# Patient Record
Sex: Female | Born: 1935 | Race: White | Hispanic: No | Marital: Married | Smoking: Current every day smoker
Health system: Southern US, Community
[De-identification: ages and names within clinical notes are randomized; demographics above are authoritative.]

## PROBLEM LIST (undated history)

## (undated) DIAGNOSIS — C9590 Leukemia, unspecified not having achieved remission: Secondary | ICD-10-CM

## (undated) DIAGNOSIS — H5440 Blindness, one eye, unspecified eye: Secondary | ICD-10-CM

## (undated) DIAGNOSIS — I10 Essential (primary) hypertension: Secondary | ICD-10-CM

## (undated) DIAGNOSIS — I6529 Occlusion and stenosis of unspecified carotid artery: Secondary | ICD-10-CM

## (undated) DIAGNOSIS — IMO0002 Reserved for concepts with insufficient information to code with codable children: Secondary | ICD-10-CM

## (undated) DIAGNOSIS — C4490 Unspecified malignant neoplasm of skin, unspecified: Secondary | ICD-10-CM

## (undated) DIAGNOSIS — I639 Cerebral infarction, unspecified: Secondary | ICD-10-CM

## (undated) DIAGNOSIS — H9192 Unspecified hearing loss, left ear: Secondary | ICD-10-CM

## (undated) DIAGNOSIS — J449 Chronic obstructive pulmonary disease, unspecified: Secondary | ICD-10-CM

---

## 1998-11-08 ENCOUNTER — Ambulatory Visit: Admission: RE | Admit: 1998-11-08 | Discharge: 1998-11-08 | Payer: Self-pay | Admitting: Internal Medicine

## 2000-06-11 ENCOUNTER — Encounter: Payer: Self-pay | Admitting: Endocrinology

## 2000-06-12 ENCOUNTER — Inpatient Hospital Stay (HOSPITAL_COMMUNITY): Admission: EM | Admit: 2000-06-12 | Discharge: 2000-06-13 | Payer: Self-pay | Admitting: Emergency Medicine

## 2000-06-14 ENCOUNTER — Emergency Department (HOSPITAL_COMMUNITY): Admission: EM | Admit: 2000-06-14 | Discharge: 2000-06-14 | Payer: Self-pay | Admitting: Internal Medicine

## 2000-06-14 ENCOUNTER — Encounter: Payer: Self-pay | Admitting: Emergency Medicine

## 2000-09-05 ENCOUNTER — Encounter: Admission: RE | Admit: 2000-09-05 | Discharge: 2000-09-05 | Payer: Self-pay

## 2001-07-14 ENCOUNTER — Encounter: Payer: Self-pay | Admitting: Internal Medicine

## 2001-07-14 ENCOUNTER — Encounter: Admission: RE | Admit: 2001-07-14 | Discharge: 2001-07-14 | Payer: Self-pay | Admitting: Internal Medicine

## 2001-11-19 ENCOUNTER — Other Ambulatory Visit: Admission: RE | Admit: 2001-11-19 | Discharge: 2001-11-19 | Payer: Self-pay | Admitting: *Deleted

## 2002-01-11 ENCOUNTER — Ambulatory Visit (HOSPITAL_COMMUNITY): Admission: RE | Admit: 2002-01-11 | Discharge: 2002-01-11 | Payer: Self-pay | Admitting: *Deleted

## 2002-01-11 ENCOUNTER — Encounter: Payer: Self-pay | Admitting: *Deleted

## 2002-03-11 ENCOUNTER — Ambulatory Visit (HOSPITAL_COMMUNITY): Admission: RE | Admit: 2002-03-11 | Discharge: 2002-03-11 | Payer: Self-pay | Admitting: *Deleted

## 2002-03-11 ENCOUNTER — Encounter: Payer: Self-pay | Admitting: *Deleted

## 2002-07-13 ENCOUNTER — Encounter: Payer: Self-pay | Admitting: *Deleted

## 2002-07-13 ENCOUNTER — Encounter: Admission: RE | Admit: 2002-07-13 | Discharge: 2002-07-13 | Payer: Self-pay | Admitting: *Deleted

## 2003-04-11 ENCOUNTER — Ambulatory Visit (HOSPITAL_COMMUNITY): Admission: RE | Admit: 2003-04-11 | Discharge: 2003-04-11 | Payer: Self-pay | Admitting: Oncology

## 2003-07-11 ENCOUNTER — Encounter: Admission: RE | Admit: 2003-07-11 | Discharge: 2003-07-11 | Payer: Self-pay | Admitting: Internal Medicine

## 2003-08-04 ENCOUNTER — Encounter: Admission: RE | Admit: 2003-08-04 | Discharge: 2003-08-04 | Payer: Self-pay | Admitting: Gastroenterology

## 2004-02-02 ENCOUNTER — Encounter: Admission: RE | Admit: 2004-02-02 | Discharge: 2004-02-02 | Payer: Self-pay | Admitting: Internal Medicine

## 2004-04-23 ENCOUNTER — Ambulatory Visit: Payer: Self-pay | Admitting: Oncology

## 2004-10-22 ENCOUNTER — Ambulatory Visit: Payer: Self-pay | Admitting: Internal Medicine

## 2005-01-18 ENCOUNTER — Ambulatory Visit: Payer: Self-pay | Admitting: Internal Medicine

## 2005-02-04 ENCOUNTER — Encounter: Admission: RE | Admit: 2005-02-04 | Discharge: 2005-02-04 | Payer: Self-pay | Admitting: Internal Medicine

## 2005-04-19 ENCOUNTER — Ambulatory Visit: Payer: Self-pay | Admitting: Internal Medicine

## 2005-07-19 ENCOUNTER — Ambulatory Visit: Payer: Self-pay | Admitting: Internal Medicine

## 2005-07-22 LAB — CBC WITH DIFFERENTIAL/PLATELET
BASO%: 0.5 % (ref 0.0–2.0)
Basophils Absolute: 0.2 10*3/uL — ABNORMAL HIGH (ref 0.0–0.1)
HCT: 38.1 % (ref 34.8–46.6)
HGB: 12.5 g/dL (ref 11.6–15.9)
MONO#: 0.5 10*3/uL (ref 0.1–0.9)
NEUT%: 9 % — ABNORMAL LOW (ref 39.6–76.8)
WBC: 38.5 10*3/uL — ABNORMAL HIGH (ref 3.9–10.0)
lymph#: 34.2 10*3/uL — ABNORMAL HIGH (ref 0.9–3.3)

## 2005-10-14 ENCOUNTER — Ambulatory Visit: Payer: Self-pay | Admitting: Internal Medicine

## 2005-10-21 LAB — CBC WITH DIFFERENTIAL/PLATELET
Eosinophils Absolute: 0 10*3/uL (ref 0.0–0.5)
MONO#: 0.5 10*3/uL (ref 0.1–0.9)
NEUT#: 5.1 10*3/uL (ref 1.5–6.5)
RBC: 3.98 10*6/uL (ref 3.70–5.32)
RDW: 13.9 % (ref 11.3–14.5)
WBC: 45.5 10*3/uL — ABNORMAL HIGH (ref 3.9–10.0)
lymph#: 39.8 10*3/uL — ABNORMAL HIGH (ref 0.9–3.3)

## 2005-10-21 LAB — LACTATE DEHYDROGENASE: LDH: 139 U/L (ref 94–250)

## 2006-01-16 ENCOUNTER — Ambulatory Visit: Payer: Self-pay | Admitting: Internal Medicine

## 2006-02-06 LAB — CBC WITH DIFFERENTIAL/PLATELET
Basophils Absolute: 0.2 10*3/uL — ABNORMAL HIGH (ref 0.0–0.1)
Eosinophils Absolute: 0.1 10*3/uL (ref 0.0–0.5)
HGB: 12.9 g/dL (ref 11.6–15.9)
MCV: 94.9 fL (ref 81.0–101.0)
MONO#: 0.5 10*3/uL (ref 0.1–0.9)
MONO%: 1.1 % (ref 0.0–13.0)
NEUT#: 5 10*3/uL (ref 1.5–6.5)
RDW: 13.4 % (ref 11.3–14.5)

## 2006-02-17 ENCOUNTER — Encounter: Admission: RE | Admit: 2006-02-17 | Discharge: 2006-02-17 | Payer: Self-pay | Admitting: Internal Medicine

## 2006-04-16 ENCOUNTER — Ambulatory Visit: Payer: Self-pay | Admitting: Internal Medicine

## 2006-04-29 LAB — CBC WITH DIFFERENTIAL/PLATELET
Basophils Absolute: 0 10*3/uL (ref 0.0–0.1)
EOS%: 2.2 % (ref 0.0–7.0)
Eosinophils Absolute: 0.8 10*3/uL — ABNORMAL HIGH (ref 0.0–0.5)
HGB: 13.4 g/dL (ref 11.6–15.9)
LYMPH%: 88.1 % — ABNORMAL HIGH (ref 14.0–48.0)
MCH: 30.1 pg (ref 26.0–34.0)
MCV: 91.4 fL (ref 81.0–101.0)
MONO%: 1.4 % (ref 0.0–13.0)
NEUT#: 3.2 10*3/uL (ref 1.5–6.5)
NEUT%: 8.3 % — ABNORMAL LOW (ref 39.6–76.8)
Platelets: 304 10*3/uL (ref 145–400)
RDW: 13.1 % (ref 11.3–14.5)

## 2006-07-24 ENCOUNTER — Ambulatory Visit: Payer: Self-pay | Admitting: Internal Medicine

## 2006-07-29 LAB — CBC WITH DIFFERENTIAL/PLATELET
BASO%: 0.3 % (ref 0.0–2.0)
EOS%: 0.1 % (ref 0.0–7.0)
HCT: 39 % (ref 34.8–46.6)
LYMPH%: 84.9 % — ABNORMAL HIGH (ref 14.0–48.0)
MCH: 30 pg (ref 26.0–34.0)
MCHC: 33.3 g/dL (ref 32.0–36.0)
MCV: 90.2 fL (ref 81.0–101.0)
MONO#: 0.5 10*3/uL (ref 0.1–0.9)
MONO%: 1.2 % (ref 0.0–13.0)
NEUT%: 13.5 % — ABNORMAL LOW (ref 39.6–76.8)
Platelets: 227 10*3/uL (ref 145–400)
RBC: 4.32 10*6/uL (ref 3.70–5.32)

## 2006-07-29 LAB — LACTATE DEHYDROGENASE: LDH: 153 U/L (ref 94–250)

## 2006-10-24 ENCOUNTER — Ambulatory Visit: Payer: Self-pay | Admitting: Internal Medicine

## 2006-10-28 LAB — CBC WITH DIFFERENTIAL/PLATELET
BASO%: 0.1 % (ref 0.0–2.0)
Basophils Absolute: 0.1 10*3/uL (ref 0.0–0.1)
EOS%: 0.1 % (ref 0.0–7.0)
HGB: 12.5 g/dL (ref 11.6–15.9)
MCH: 31.1 pg (ref 26.0–34.0)
MCHC: 33.9 g/dL (ref 32.0–36.0)
MCV: 91.6 fL (ref 81.0–101.0)
MONO%: 2.2 % (ref 0.0–13.0)
RDW: 13.4 % (ref 11.3–14.5)

## 2007-01-22 ENCOUNTER — Ambulatory Visit: Payer: Self-pay | Admitting: Internal Medicine

## 2007-02-05 LAB — CBC WITH DIFFERENTIAL/PLATELET
BASO%: 0.4 % (ref 0.0–2.0)
EOS%: 0.1 % (ref 0.0–7.0)
HCT: 36.7 % (ref 34.8–46.6)
LYMPH%: 90.9 % — ABNORMAL HIGH (ref 14.0–48.0)
MCH: 30.9 pg (ref 26.0–34.0)
MCHC: 33.8 g/dL (ref 32.0–36.0)
MCV: 91.6 fL (ref 81.0–101.0)
MONO#: 0.6 10*3/uL (ref 0.1–0.9)
MONO%: 1.1 % (ref 0.0–13.0)
NEUT%: 7.5 % — ABNORMAL LOW (ref 39.6–76.8)
Platelets: 293 10*3/uL (ref 145–400)
RBC: 4.01 10*6/uL (ref 3.70–5.32)

## 2007-02-13 ENCOUNTER — Ambulatory Visit (HOSPITAL_COMMUNITY): Admission: RE | Admit: 2007-02-13 | Discharge: 2007-02-13 | Payer: Self-pay | Admitting: Internal Medicine

## 2007-02-13 LAB — COMPREHENSIVE METABOLIC PANEL
AST: 31 U/L (ref 0–37)
Albumin: 4 g/dL (ref 3.5–5.2)
Alkaline Phosphatase: 62 U/L (ref 39–117)
BUN: 11 mg/dL (ref 6–23)
Creatinine, Ser: 0.77 mg/dL (ref 0.40–1.20)
Potassium: 4.4 mEq/L (ref 3.5–5.3)

## 2007-02-13 LAB — CBC WITH DIFFERENTIAL/PLATELET
BASO%: 0.5 % (ref 0.0–2.0)
Basophils Absolute: 0.3 10*3/uL — ABNORMAL HIGH (ref 0.0–0.1)
EOS%: 0.3 % (ref 0.0–7.0)
HGB: 12 g/dL (ref 11.6–15.9)
MCH: 31.1 pg (ref 26.0–34.0)
MCHC: 33.8 g/dL (ref 32.0–36.0)
MCV: 91.9 fL (ref 81.0–101.0)
MONO%: 1.9 % (ref 0.0–13.0)
RDW: 14 % (ref 11.3–14.5)

## 2007-08-14 ENCOUNTER — Ambulatory Visit: Payer: Self-pay | Admitting: Internal Medicine

## 2007-08-18 LAB — COMPREHENSIVE METABOLIC PANEL
AST: 18 U/L (ref 0–37)
Alkaline Phosphatase: 59 U/L (ref 39–117)
BUN: 12 mg/dL (ref 6–23)
Calcium: 9 mg/dL (ref 8.4–10.5)
Chloride: 104 mEq/L (ref 96–112)
Creatinine, Ser: 0.74 mg/dL (ref 0.40–1.20)

## 2007-08-18 LAB — CBC WITH DIFFERENTIAL/PLATELET
Basophils Absolute: 0 10*3/uL (ref 0.0–0.1)
EOS%: 0.2 % (ref 0.0–7.0)
HCT: 37 % (ref 34.8–46.6)
HGB: 12.4 g/dL (ref 11.6–15.9)
MCH: 30.6 pg (ref 26.0–34.0)
MCV: 91.5 fL (ref 81.0–101.0)
MONO%: 2 % (ref 0.0–13.0)
NEUT%: 13 % — ABNORMAL LOW (ref 39.6–76.8)

## 2008-02-15 ENCOUNTER — Ambulatory Visit: Payer: Self-pay | Admitting: Internal Medicine

## 2008-02-24 LAB — CBC WITH DIFFERENTIAL/PLATELET
BASO%: 0.2 % (ref 0.0–2.0)
Basophils Absolute: 0.1 10*3/uL (ref 0.0–0.1)
EOS%: 0.2 % (ref 0.0–7.0)
Eosinophils Absolute: 0.1 10*3/uL (ref 0.0–0.5)
HCT: 36.2 % (ref 34.8–46.6)
HGB: 12 g/dL (ref 11.6–15.9)
LYMPH%: 88.3 % — ABNORMAL HIGH (ref 14.0–48.0)
MCH: 30.4 pg (ref 26.0–34.0)
MCHC: 33.1 g/dL (ref 32.0–36.0)
MCV: 91.8 fL (ref 81.0–101.0)
MONO#: 0.9 10*3/uL (ref 0.1–0.9)
MONO%: 1.8 % (ref 0.0–13.0)
NEUT#: 4.7 10*3/uL (ref 1.5–6.5)
NEUT%: 9.5 % — ABNORMAL LOW (ref 39.6–76.8)
Platelets: 243 10*3/uL (ref 145–400)
RBC: 3.95 10*6/uL (ref 3.70–5.32)
RDW: 13.9 % (ref 11.3–14.5)
WBC: 49.3 10*3/uL — ABNORMAL HIGH (ref 3.9–10.0)
lymph#: 43.5 10*3/uL — ABNORMAL HIGH (ref 0.9–3.3)

## 2008-02-24 LAB — COMPREHENSIVE METABOLIC PANEL
ALT: 15 U/L (ref 0–35)
AST: 21 U/L (ref 0–37)
Chloride: 103 mEq/L (ref 96–112)
Creatinine, Ser: 0.93 mg/dL (ref 0.40–1.20)
Total Bilirubin: 0.4 mg/dL (ref 0.3–1.2)

## 2008-03-28 ENCOUNTER — Encounter: Admission: RE | Admit: 2008-03-28 | Discharge: 2008-03-28 | Payer: Self-pay | Admitting: Endocrinology

## 2008-06-05 ENCOUNTER — Encounter: Admission: RE | Admit: 2008-06-05 | Discharge: 2008-06-05 | Payer: Self-pay | Admitting: Endocrinology

## 2008-06-14 ENCOUNTER — Encounter: Admission: RE | Admit: 2008-06-14 | Discharge: 2008-06-14 | Payer: Self-pay | Admitting: Endocrinology

## 2008-07-19 ENCOUNTER — Ambulatory Visit (HOSPITAL_COMMUNITY): Admission: RE | Admit: 2008-07-19 | Discharge: 2008-07-19 | Payer: Self-pay | Admitting: *Deleted

## 2008-08-26 ENCOUNTER — Ambulatory Visit: Payer: Self-pay | Admitting: Internal Medicine

## 2008-08-30 LAB — CBC WITH DIFFERENTIAL/PLATELET
BASO%: 0.1 % (ref 0.0–2.0)
Basophils Absolute: 0.1 10*3/uL (ref 0.0–0.1)
EOS%: 0.1 % (ref 0.0–7.0)
Eosinophils Absolute: 0 10*3/uL (ref 0.0–0.5)
HCT: 37.8 % (ref 34.8–46.6)
HGB: 12.2 g/dL (ref 11.6–15.9)
LYMPH%: 86 % — ABNORMAL HIGH (ref 14.0–49.7)
MCH: 29.4 pg (ref 25.1–34.0)
MCHC: 32.3 g/dL (ref 31.5–36.0)
MCV: 90.9 fL (ref 79.5–101.0)
MONO#: 1 10*3/uL — ABNORMAL HIGH (ref 0.1–0.9)
MONO%: 2 % (ref 0.0–14.0)
NEUT#: 6 10*3/uL (ref 1.5–6.5)
NEUT%: 11.8 % — ABNORMAL LOW (ref 38.4–76.8)
Platelets: 331 10*3/uL (ref 145–400)
RBC: 4.16 10*6/uL (ref 3.70–5.45)
RDW: 14.8 % — ABNORMAL HIGH (ref 11.2–14.5)
WBC: 50.5 10*3/uL (ref 3.9–10.3)
lymph#: 43.4 10*3/uL — ABNORMAL HIGH (ref 0.9–3.3)

## 2008-08-30 LAB — LACTATE DEHYDROGENASE: LDH: 178 U/L (ref 94–250)

## 2009-03-23 ENCOUNTER — Ambulatory Visit: Payer: Self-pay | Admitting: Internal Medicine

## 2009-03-27 LAB — CBC WITH DIFFERENTIAL/PLATELET
BASO%: 0.2 % (ref 0.0–2.0)
Basophils Absolute: 0.1 10*3/uL (ref 0.0–0.1)
EOS%: 0.1 % (ref 0.0–7.0)
Eosinophils Absolute: 0.1 10*3/uL (ref 0.0–0.5)
HCT: 36.8 % (ref 34.8–46.6)
HGB: 12.1 g/dL (ref 11.6–15.9)
LYMPH%: 87.9 % — ABNORMAL HIGH (ref 14.0–49.7)
MCH: 30.4 pg (ref 25.1–34.0)
MCHC: 32.7 g/dL (ref 31.5–36.0)
MCV: 92.8 fL (ref 79.5–101.0)
MONO#: 0.7 10*3/uL (ref 0.1–0.9)
MONO%: 1.2 % (ref 0.0–14.0)
NEUT#: 5.8 10*3/uL (ref 1.5–6.5)
NEUT%: 10.6 % — ABNORMAL LOW (ref 38.4–76.8)
Platelets: 263 10*3/uL (ref 145–400)
RBC: 3.97 10*6/uL (ref 3.70–5.45)
RDW: 15.6 % — ABNORMAL HIGH (ref 11.2–14.5)
WBC: 54.8 10*3/uL (ref 3.9–10.3)
lymph#: 48.1 10*3/uL — ABNORMAL HIGH (ref 0.9–3.3)

## 2009-03-27 LAB — COMPREHENSIVE METABOLIC PANEL
ALT: 13 U/L (ref 0–35)
AST: 20 U/L (ref 0–37)
Albumin: 4.5 g/dL (ref 3.5–5.2)
Alkaline Phosphatase: 61 U/L (ref 39–117)
BUN: 8 mg/dL (ref 6–23)
CO2: 29 mEq/L (ref 19–32)
Calcium: 9.2 mg/dL (ref 8.4–10.5)
Chloride: 103 mEq/L (ref 96–112)
Creatinine, Ser: 0.84 mg/dL (ref 0.40–1.20)
Glucose, Bld: 102 mg/dL — ABNORMAL HIGH (ref 70–99)
Potassium: 3.7 mEq/L (ref 3.5–5.3)
Sodium: 141 mEq/L (ref 135–145)
Total Bilirubin: 0.5 mg/dL (ref 0.3–1.2)
Total Protein: 7.1 g/dL (ref 6.0–8.3)

## 2009-03-27 LAB — LACTATE DEHYDROGENASE: LDH: 152 U/L (ref 94–250)

## 2009-08-02 ENCOUNTER — Encounter: Admission: RE | Admit: 2009-08-02 | Discharge: 2009-08-02 | Payer: Self-pay | Admitting: Endocrinology

## 2009-09-22 ENCOUNTER — Ambulatory Visit: Payer: Self-pay | Admitting: Internal Medicine

## 2009-09-26 LAB — CBC WITH DIFFERENTIAL/PLATELET
BASO%: 0.3 % (ref 0.0–2.0)
Basophils Absolute: 0.2 10*3/uL — ABNORMAL HIGH (ref 0.0–0.1)
EOS%: 0.2 % (ref 0.0–7.0)
Eosinophils Absolute: 0.1 10*3/uL (ref 0.0–0.5)
HCT: 37 % (ref 34.8–46.6)
HGB: 12 g/dL (ref 11.6–15.9)
LYMPH%: 89.5 % — ABNORMAL HIGH (ref 14.0–49.7)
MCH: 29.3 pg (ref 25.1–34.0)
MCHC: 32.5 g/dL (ref 31.5–36.0)
MCV: 90.1 fL (ref 79.5–101.0)
MONO#: 1.4 10*3/uL — ABNORMAL HIGH (ref 0.1–0.9)
MONO%: 2.7 % (ref 0.0–14.0)
NEUT#: 3.9 10*3/uL (ref 1.5–6.5)
NEUT%: 7.3 % — ABNORMAL LOW (ref 38.4–76.8)
Platelets: 257 10*3/uL (ref 145–400)
RBC: 4.11 10*6/uL (ref 3.70–5.45)
RDW: 15.7 % — ABNORMAL HIGH (ref 11.2–14.5)
WBC: 52.6 10*3/uL (ref 3.9–10.3)
lymph#: 47.1 10*3/uL — ABNORMAL HIGH (ref 0.9–3.3)

## 2009-09-26 LAB — LACTATE DEHYDROGENASE: LDH: 170 U/L (ref 94–250)

## 2010-03-27 ENCOUNTER — Ambulatory Visit: Payer: Self-pay | Admitting: Internal Medicine

## 2010-03-28 LAB — CBC WITH DIFFERENTIAL/PLATELET
BASO%: 0.3 % (ref 0.0–2.0)
Basophils Absolute: 0.1 10*3/uL (ref 0.0–0.1)
EOS%: 0.2 % (ref 0.0–7.0)
Eosinophils Absolute: 0.1 10*3/uL (ref 0.0–0.5)
HCT: 33.6 % — ABNORMAL LOW (ref 34.8–46.6)
HGB: 10.9 g/dL — ABNORMAL LOW (ref 11.6–15.9)
LYMPH%: 90.3 % — ABNORMAL HIGH (ref 14.0–49.7)
MCH: 28.7 pg (ref 25.1–34.0)
MCHC: 32.6 g/dL (ref 31.5–36.0)
MCV: 88 fL (ref 79.5–101.0)
MONO#: 0.7 10*3/uL (ref 0.1–0.9)
MONO%: 1.4 % (ref 0.0–14.0)
NEUT#: 3.9 10*3/uL (ref 1.5–6.5)
NEUT%: 7.8 % — ABNORMAL LOW (ref 38.4–76.8)
Platelets: 245 10*3/uL (ref 145–400)
RBC: 3.81 10*6/uL (ref 3.70–5.45)
RDW: 15.9 % — ABNORMAL HIGH (ref 11.2–14.5)
WBC: 49.9 10*3/uL — ABNORMAL HIGH (ref 3.9–10.3)
lymph#: 45 10*3/uL — ABNORMAL HIGH (ref 0.9–3.3)

## 2010-03-28 LAB — LACTATE DEHYDROGENASE: LDH: 143 U/L (ref 94–250)

## 2010-08-21 NOTE — Op Note (Signed)
NAMESHERONICA, COREY                ACCOUNT NO.:  [id number]   MEDICAL RECORD NO.:  [id number]          PATIENT TYPE:  AMB   LOCATION:  ENDO                         FACILITY:  [facility]   PHYSICIAN:  Georgiana Spinner, M.D.    DATE OF BIRTH:  Nov 04, 1930   DATE OF PROCEDURE:  07/19/2008  DATE OF DISCHARGE:                               OPERATIVE REPORT   PROCEDURE:  Upper endoscopy.   INDICATIONS FOR PROCEDURE:  Gastroesophageal reflux disease..   ANESTHESIA:  Fentanyl 100 mcg, versed 10 mg.   PROCEDURE IN DETAIL:  With the patient in a highly sedated in the left  lateral decubitus position, the Pentax videoscopic endoscope was  inserted and now under direct vision through the esophagus, which  appeared normal, into the stomach.  Fundus, body, antrum, duodenal bulb,  second portion of the duodenum were visualized and all appeared normal.  From this point, the endoscope was slowly withdrawn, taken  circumferential views of colonic mucosa until the endoscope was pulled  back and the stomach flexed in retroflexion into the stomach above.  The  endoscope was straightened and withdrawn, taking circumferential views  of any gastric esophageal mucosa.  The patient's vital signs on pulse  oximetry remained stable.  The patient tolerated the procedure well  without any apparent complications.   FINDINGS:  Rather unremarkable endoscopic examination.   PLAN:  Proceed to colonoscopy.           ______________________________  Georgiana Spinner, M.D.     GMO/MEDQ  D:  07/19/2008  T:  07/19/2008  Job:  [id number]

## 2010-08-21 NOTE — Op Note (Signed)
NAMEANNASOPHIA, Heather Avery                ACCOUNT NO.:  [id number]   MEDICAL RECORD NO.:  [id number]          PATIENT TYPE:  AMB   LOCATION:  ENDO                         FACILITY:  [facility]   PHYSICIAN:  Georgiana Spinner, M.D.    DATE OF BIRTH:  [birth year]   DATE OF PROCEDURE:  07/19/2008  DATE OF DISCHARGE:                               OPERATIVE REPORT   PROCEDURE:  Colonoscopy.   INDICATIONS:  Cancer screening, colon polyps.   ANESTHESIA:  No further anesthesia given.   PROCEDURE:  With the patient mildly sedated in the left lateral  decubitus position, the Pentax videoscopic pediatric colonoscope was  inserted in the rectum and passed under direct vision with pressure  applied.  We went through a rather significant tortuosity and  diverticula-filled sigmoid colon but subsequently were able to reach the  cecum, identified by the ileocecal valve and appendiceal orifice, both  of which were photographed. From this point, the colonoscope was slowly  withdrawn, taking circumferential views of the colonic mucosa, stopping  only in the rectum which appeared normal on direct and showed  hemorrhoids on retroflexed view. The endoscope was then straightened and  withdrawn. The patient's vital signs and pulse oximetry remained stable.  The patient tolerated the procedure well without apparent complications.   FINDINGS:  Significant diverticulosis and tortuosity of the sigmoid  colon and internal hemorrhoids.  Otherwise unremarkable exam.   PLAN:  Consider repeat examination in 5-10 years.           ______________________________  Georgiana Spinner, M.D.     GMO/MEDQ  D:  07/19/2008  T:  07/19/2008  Job:  [id number]

## 2010-08-24 NOTE — H&P (Signed)
[facility]  Patient:    Heather Avery, Heather Avery                       MRN: [id number] Adm. Date:  [id number] Attending:  Jenel Lucks Dictator:   Bernadene Person, M.D.                         History and Physical  DATE OF BIRTH:  09-05-1930.  CHIEF COMPLAINT:  Blind in right eye, a complete right carotid occlusion.  HISTORY OF PRESENT ILLNESS:  [age]-year-old white female patient of Dr. 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  She underwent carotid Dopplers, December 11, 1999, by Dr. Kristen Loader. Early and report indicated that right internal carotid artery stenosis was approximately 60-80%.  At that time, she was taking aspirin and they planned to repeat carotid Doppler in six months unless any unexplained symptoms occurred.  Yesterday morning, she awoke with decreased vision in her right eye, only able to see darkness described as "a blurred light."  She went to Dr. Nicholes Mango office and Doppler completed did reveal a complete occlusion of the right internal carotid artery.  Subsequent CAT scan of the head did reveal a subacute retinal infarct.  She also describes having symptoms one week ago of a heaviness in the left arm that lasted several hours but resolved; otherwise, she had had no numbness, tingling, other unilateral symptoms or any other neurological symptoms.  She has had no associated chest pain, shortness of breath, palpitations, nausea, vomiting, diaphoresis, change in speech or gait.  She has been admitted for heparinization, Coumadin and subsequent Plavix treatment; this plan was discussed with Dr. Arbie Cookey.  She had been on aspirin once daily but stopped taking it approximately one week ago because she began taking Motrin for arthritis pain and was concerned about taking both medications together.  CURRENT MEDICATIONS  1. Dyazide 25/37.5 mg one q.d.  2.  Zoloft 25 mg one q.d.  3. Multivitamin q.d.  4. Zocor 40 mg h.s.  5. Aspirin q.d., noting that she did stop taking aspirin last week.  ALLERGIES:  CODEINE -- PREVIOUS CODEINE IN COUGH SYRUP did cause mild eye swelling.  PAST MEDICAL HISTORY:  Right carotid stenosis 60-80%, diagnosed December 11, 1999.  Cervical and thoracic degenerative disk disease. Osteoarthritis.  A normal bone DEXA scan, July of 2001.  Chronic smoker of 1 pack per day for 43 years.  COPD.  Hyperlipidemia, treated with Zocor. Hypertension, treated with Dyazide.  Depression, treated with Zoloft. Intermittent mild lower extremity edema.  PAST SURGICAL HISTORY:  Previous surgeries include hysterectomy secondary to fibroids, cholecystectomy, hemorrhoidectomy and basal cell removed from the right facial area.  FAMILY HISTORY:  Mother deceased at 75 with hypertension, Alzheimers and several strokes.  Father died at 43 with multiple sclerosis but he did really die secondary to pneumonia.  She has one half sister who is 10 and healthy. Has two daughters and one son whom are also quite healthy.  SOCIAL HISTORY:  She is married with three children.  Denies any alcohol use but has smoked approximately 1 pack per day for 43 years.  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  REVIEW OF SYSTEMS:  Negative except as described above in history of present illness.  LABORATORY VALUES:  INR 0.9.  Comprehensive metabolic  panel normal.  CBC normal.  CAT scan of head revealed a subacute infarct within the right posterior MCA distribution without any evidence of hemorrhage.  PHYSICAL EXAMINATION  VITAL SIGNS:  Pulse 60 and regular, blood pressure 130/59, respirations 12. Temperature is not available.  GENERAL:  A pleasant, well-developed, well-nourished white female.  She is resting comfortably supine in no acute distress.  Her breakfast is brought in and she is hungry and eager  to eat that.  SKIN:  Warm and dry without any evidence of rash.  HEENT:  Normocephalic, atraumatic.  Both pupils are equally round and react to light and accommodation.  Extraocular muscles are intact and there is no evidence of nystagmus.  Funduscopic exam is similar bilaterally without any evidence of hemorrhage or papilledema.  Oropharynx is clear.  NECK:  Supple without bruit.  LUNGS:  Clear to auscultation bilaterally.  CARDIOVASCULAR:  Normal sinus rhythm without murmur, rub or gallop.  ABDOMEN:  Mildly obese.  Bowel sounds present.  Soft, nontender, nondistended. No rebound, mass or organomegaly.  EXTREMITIES:  Without clubbing, cyanosis, or edema.  GENITOURINARY:  Deferred, noncontributory.  RECTAL:  Deferred, noncontributory.  NEUROLOGIC:  She is alert and oriented x 3.  She has normal speech.  No evidence of tremor.  Her gait has not been tested but she moves about the bed easily and can move all extremities against resistance.  She does have decreased visual acuity in the right eye.  ASSESSMENT  1. 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  2. Complete occlusion of the right internal carotid artery.  3. Hypertension, controlled with Dyazide.  4. Chronic smoker.  5. Hyperlipidemia, treated with Zocor.  Her dosage of Zocor was just     increased to 40 mg on February 13th.  6. Osteoarthritis.  7. Degenerative disk disease of the cervical and thoracic spine.  8. Chronic obstructive pulmonary disease.  9. Depression, treated with Zoloft. 10. Intermittent mild lower extremity edema, currently stable. 11. Previous surgeries:     a. Hysterectomy secondary to fibroids.     b. Cholecystectomy.     c. Hemorrhoidectomy.     f. Right facial basal cell cancer removed. 12. Normal bone DEXA scan, July 2001.  PLAN:  Admit to Dr. Shelva Majestic.  Will initiate heparinization with intent to begin Coumadin as well as subsequent Plavix; will otherwise continue her  home medications and aggressively treat her hyperlipidemia as well as control of blood pressure.  DD:  06/12/00 TD:  06/12/00 Job: [id number]

## 2010-08-24 NOTE — Discharge Summary (Signed)
[facility]  Patient:    Heather Avery, Heather Avery                       MRN: [id number] Adm. Date:  [id number] Disc. Date: 06/13/00 Attending:  Jenel Lucks                           Discharge Summary  REASON FOR ADMISSION:         "Blind in her right eye."  HOSPITAL COURSE:              The patient was admitted for "blind in her right eye" and has been found to have a complete right carotid artery occlusion. She has been seen by CVTS who feel that there is no way that they can do any operative intervention and they recommend her to be on Coumadin and subsequent Plavix. She is currently heparinized and my plan is for her to go home with Lovenox subcutaneously and Coumadin and continue to follow her as an outpatient.  CURRENT MEDICATIONS:          1. Dyazide 25/37.5 mg one p.o.q.d.                               2. Zoloft 25 mg p.o.q.d.                               3. Multivitamin.                               4. Zocor 40 mg p.o. q.h.s.                               5. Aspirin one p.o.q.d. (although she did stop                                  taking her aspirin last week).  ALLERGIES:                    CODEINE.  HOSPITAL COURSE:              On hospitalization she has been totally fine, although, with some blindness in her right eye. She is on a heparin drip and hopefully, we will switch her over to Lovenox and Coumadin today. I will discuss this with Dr. Arbie Cookey as well as pharmacy management. Her discharge medicines will be her home medicines as well as Lovenox, I believe her dose will be 70 mg b.i.d. subcutaneously, however, I will verify this with pharmacy as well as Coumadin 5 mg p.o.q.d. x 3 and we will recheck her INR and PT in my office on Monday.  She did have a normal chest x-ray, normal electrolytes and normal CBC on admission. She did have a CT of the head without contrast which showed subcutaneous infarct within the right posterior MCA  distribution, no evidence of hemorrhage on June 11, 2000. Her EKG was sinus rhythm.  DISCHARGE MEDICATIONS:        1. Dyazide 25/37.5 mg one p.o.q.d.  2. Zoloft 25 mg p.o.q.d.                               3. Multivitamin.                               4. Zocor 40 mg p.o. q.h.s.                               5. Aspirin one p.o.q.d.                               6. *Lovenox 70 mg b.i.d. (new-check with                                  pharmacy).                               7. *Coumadin 5 mg p.o.q.d.(new-check with                                  pharmacy).  DISCHARGE INSTRUCTIONS:       1. Her diet is cardiac.                               2. She has no wound care.                               3. No restrictions on activity.                               4. Her follow-up is as above. DD:  06/13/00 TD:  06/13/00 Job: [id number]

## 2010-10-02 ENCOUNTER — Other Ambulatory Visit: Payer: Self-pay | Admitting: Internal Medicine

## 2010-10-02 ENCOUNTER — Encounter (HOSPITAL_BASED_OUTPATIENT_CLINIC_OR_DEPARTMENT_OTHER): Payer: Medicare Other | Admitting: Internal Medicine

## 2010-10-02 DIAGNOSIS — C9110 Chronic lymphocytic leukemia of B-cell type not having achieved remission: Secondary | ICD-10-CM

## 2010-10-02 LAB — LACTATE DEHYDROGENASE: LDH: 212 U/L (ref 94–250)

## 2010-10-02 LAB — COMPREHENSIVE METABOLIC PANEL
ALT: 16 U/L (ref 0–35)
AST: 23 U/L (ref 0–37)
Alkaline Phosphatase: 76 U/L (ref 39–117)
BUN: 9 mg/dL (ref 6–23)
CO2: 29 mEq/L (ref 19–32)
Chloride: 95 mEq/L — ABNORMAL LOW (ref 96–112)
Creatinine, Ser: 0.72 mg/dL (ref 0.50–1.10)
Glucose, Bld: 81 mg/dL (ref 70–99)
Potassium: 3.4 mEq/L — ABNORMAL LOW (ref 3.5–5.3)
Sodium: 135 mEq/L (ref 135–145)
Total Bilirubin: 0.3 mg/dL (ref 0.3–1.2)
Total Protein: 7 g/dL (ref 6.0–8.3)

## 2010-10-02 LAB — CBC WITH DIFFERENTIAL/PLATELET
BASO%: 0.2 % (ref 0.0–2.0)
Basophils Absolute: 0.1 10e3/uL (ref 0.0–0.1)
EOS%: 0.4 % (ref 0.0–7.0)
Eosinophils Absolute: 0.2 10e3/uL (ref 0.0–0.5)
HCT: 34.5 % — ABNORMAL LOW (ref 34.8–46.6)
HGB: 11.2 g/dL — ABNORMAL LOW (ref 11.6–15.9)
LYMPH%: 84.9 % — ABNORMAL HIGH (ref 14.0–49.7)
MCH: 27.9 pg (ref 25.1–34.0)
MCHC: 32.4 g/dL (ref 31.5–36.0)
MCV: 86.1 fL (ref 79.5–101.0)
MONO#: 0.9 10e3/uL (ref 0.1–0.9)
MONO%: 2.1 % (ref 0.0–14.0)
NEUT#: 5.2 10e3/uL (ref 1.5–6.5)
NEUT%: 12.4 % — ABNORMAL LOW (ref 38.4–76.8)
Platelets: 255 10e3/uL (ref 145–400)
RBC: 4.01 10e6/uL (ref 3.70–5.45)
RDW: 15.5 % — ABNORMAL HIGH (ref 11.2–14.5)
WBC: 41.7 10e3/uL — ABNORMAL HIGH (ref 3.9–10.3)
lymph#: 35.4 10e3/uL — ABNORMAL HIGH (ref 0.9–3.3)

## 2011-01-01 ENCOUNTER — Emergency Department (HOSPITAL_COMMUNITY): Payer: Medicare Other

## 2011-01-01 ENCOUNTER — Emergency Department (HOSPITAL_COMMUNITY)
Admission: EM | Admit: 2011-01-01 | Discharge: 2011-01-01 | Disposition: A | Discharge status: Home / Self Care | Payer: Medicare Other | Attending: Emergency Medicine | Admitting: Emergency Medicine

## 2011-01-01 DIAGNOSIS — F3289 Other specified depressive episodes: Secondary | ICD-10-CM | POA: Insufficient documentation

## 2011-01-01 DIAGNOSIS — S129XXA Fracture of neck, unspecified, initial encounter: Secondary | ICD-10-CM | POA: Insufficient documentation

## 2011-01-01 DIAGNOSIS — F329 Major depressive disorder, single episode, unspecified: Secondary | ICD-10-CM | POA: Insufficient documentation

## 2011-01-01 DIAGNOSIS — W010XXA Fall on same level from slipping, tripping and stumbling without subsequent striking against object, initial encounter: Secondary | ICD-10-CM | POA: Insufficient documentation

## 2011-01-01 DIAGNOSIS — Z8673 Personal history of transient ischemic attack (TIA), and cerebral infarction without residual deficits: Secondary | ICD-10-CM | POA: Insufficient documentation

## 2011-01-01 DIAGNOSIS — Z79899 Other long term (current) drug therapy: Secondary | ICD-10-CM | POA: Insufficient documentation

## 2011-01-01 DIAGNOSIS — I10 Essential (primary) hypertension: Secondary | ICD-10-CM | POA: Insufficient documentation

## 2011-01-01 LAB — POCT I-STAT, CHEM 8
Calcium, Ion: 1.07 mmol/L — ABNORMAL LOW (ref 1.12–1.32)
HCT: 37 % (ref 36.0–46.0)
TCO2: 27 mmol/L (ref 0–100)

## 2011-01-01 LAB — PROTIME-INR: Prothrombin Time: 12.4 seconds (ref 11.6–15.2)

## 2011-02-22 ENCOUNTER — Emergency Department (HOSPITAL_COMMUNITY): Payer: Medicare Other

## 2011-02-22 ENCOUNTER — Encounter: Payer: Self-pay | Admitting: Emergency Medicine

## 2011-02-22 ENCOUNTER — Emergency Department (HOSPITAL_COMMUNITY)
Admission: EM | Admit: 2011-02-22 | Discharge: 2011-02-23 | Disposition: A | Discharge status: Home / Self Care | Payer: Medicare Other | Attending: Emergency Medicine | Admitting: Emergency Medicine

## 2011-02-22 DIAGNOSIS — IMO0001 Reserved for inherently not codable concepts without codable children: Secondary | ICD-10-CM | POA: Insufficient documentation

## 2011-02-22 DIAGNOSIS — Z8673 Personal history of transient ischemic attack (TIA), and cerebral infarction without residual deficits: Secondary | ICD-10-CM | POA: Insufficient documentation

## 2011-02-22 DIAGNOSIS — M51379 Other intervertebral disc degeneration, lumbosacral region without mention of lumbar back pain or lower extremity pain: Secondary | ICD-10-CM | POA: Insufficient documentation

## 2011-02-22 DIAGNOSIS — M25551 Pain in right hip: Secondary | ICD-10-CM

## 2011-02-22 DIAGNOSIS — I10 Essential (primary) hypertension: Secondary | ICD-10-CM | POA: Insufficient documentation

## 2011-02-22 DIAGNOSIS — M5137 Other intervertebral disc degeneration, lumbosacral region: Secondary | ICD-10-CM | POA: Insufficient documentation

## 2011-02-22 DIAGNOSIS — M25559 Pain in unspecified hip: Secondary | ICD-10-CM | POA: Insufficient documentation

## 2011-02-22 DIAGNOSIS — R269 Unspecified abnormalities of gait and mobility: Secondary | ICD-10-CM | POA: Insufficient documentation

## 2011-02-22 HISTORY — DX: Unspecified malignant neoplasm of skin, unspecified: C44.90

## 2011-02-22 HISTORY — DX: Occlusion and stenosis of unspecified carotid artery: I65.29

## 2011-02-22 HISTORY — DX: Cerebral infarction, unspecified: I63.9

## 2011-02-22 HISTORY — DX: Unspecified hearing loss, left ear: H91.92

## 2011-02-22 HISTORY — DX: Essential (primary) hypertension: I10

## 2011-02-22 HISTORY — DX: Chronic obstructive pulmonary disease, unspecified: J44.9

## 2011-02-22 HISTORY — DX: Reserved for concepts with insufficient information to code with codable children: IMO0002

## 2011-02-22 HISTORY — DX: Blindness, one eye, unspecified eye: H54.40

## 2011-02-22 HISTORY — DX: Leukemia, unspecified not having achieved remission: C95.90

## 2011-02-22 MED ORDER — MORPHINE SULFATE 4 MG/ML IJ SOLN
4.0000 mg | Freq: Once | INTRAMUSCULAR | Status: AC
  Administered 2011-02-22: 4 mg via INTRAMUSCULAR
  Filled 2011-02-22: qty 1

## 2011-02-22 NOTE — ED Notes (Signed)
Pt presenting to ed with c/o right hip pain

## 2011-02-22 NOTE — ED Provider Notes (Signed)
History     CSN: [id number] Arrival date & time: 02/22/2011  6:19 PM   First MD Initiated Contact with Patient 02/22/11 2115      Chief Complaint  Patient presents with  . Hip Pain    (Consider location/radiation/quality/duration/timing/severity/associated sxs/prior treatment) Patient is a 77 y.o. female presenting with hip pain. The history is provided by the patient.  Hip Pain This is a new problem. The current episode started today. The problem occurs constantly. The problem has been unchanged. Associated symptoms include myalgias. Pertinent negatives include no abdominal pain, arthralgias, chest pain, chills, fever, joint swelling, nausea, numbness, rash, vomiting or weakness. The symptoms are aggravated by standing and walking. She has tried oral narcotics for the symptoms. The treatment provided mild relief.   Patient states that she was putting on some pants this afternoon around 1 PM. She was standing on her right leg, putting on the left pants leg, when she had a sudden pain in her right hip and felt like it gave out. She did not fall when this happened, but sat down on her bed. Since that time, she has had pain in the hip and feels unable to bear weight as it is very painful. The pain seems to be located mostly posteriorly. She did take a "pain pill" at home around 2 PM but this did not help much. She denies any previous history of injury to the hip or orthopedic procedures. She has not noticed any swelling or deformity or bruising to the hip.  Past Medical History  Diagnosis Date  . Hypertension   . COPD (chronic obstructive pulmonary disease)   . Leukemia   . Degeneration of intervertebral disc   . Stroke   . Carotid artery occlusion   . Blind right eye   . Hearing loss in left ear   . Skin cancer     History reviewed. No pertinent past surgical history.  History reviewed. No pertinent family history.  History  Substance Use Topics  . Smoking status: Current  Everyday Smoker -- 0.5 packs/day    Types: Cigarettes  . Smokeless tobacco: Not on file  . Alcohol Use: Yes     occasional    OB History    Grav Para Term Preterm Abortions TAB SAB Ect Mult Living                  Review of Systems  Constitutional: Negative for fever, chills, activity change and appetite change.  Respiratory: Negative for chest tightness and shortness of breath.   Cardiovascular: Negative for chest pain.  Gastrointestinal: Negative for nausea, vomiting and abdominal pain.  Musculoskeletal: Positive for myalgias and gait problem. Negative for back pain, joint swelling and arthralgias.  Skin: Negative for color change, rash and wound.  Neurological: Negative for dizziness, weakness and numbness.    Allergies  Hydrocodone  Home Medications   Current Outpatient Rx  Name Route Sig Dispense Refill  . CLOPIDOGREL BISULFATE 75 MG PO TABS Oral Take 75 mg by mouth daily.      Marland Kitchen HYDROCODONE-ACETAMINOPHEN 5-325 MG PO TABS Oral Take 1 tablet by mouth every 6 (six) hours as needed. For pain     . LEVOTHYROXINE SODIUM 100 MCG PO TABS Oral Take 100 mcg by mouth daily.      Marland Kitchen LORAZEPAM 0.5 MG PO TABS Oral Take 0.5 mg by mouth 3 (three) times daily as needed. For anxiety     . ROSUVASTATIN CALCIUM 20 MG PO TABS Oral Take 20  mg by mouth at bedtime.      Marland Kitchen TIOTROPIUM BROMIDE MONOHYDRATE 18 MCG IN CAPS Inhalation Place 18 mcg into inhaler and inhale daily.      . TRIAMTERENE-HCTZ 37.5-25 MG PO CAPS Oral Take 1 capsule by mouth every morning.      . VENLAFAXINE HCL 75 MG PO CP24 Oral Take 75 mg by mouth at bedtime.        BP 151/66  Pulse 73  Temp(Src) 98.3 F (36.8 C) (Oral)  Resp 16  SpO2 96%  Physical Exam  Nursing note and vitals reviewed. Constitutional: She is oriented to person, place, and time. She appears well-developed and well-nourished. No distress.  HENT:  Head: Normocephalic and atraumatic.  Right Ear: External ear normal.  Left Ear: External ear normal.    Eyes: Conjunctivae are normal. Pupils are equal, round, and reactive to light.  Neck: Normal range of motion.  Cardiovascular: Normal rate and regular rhythm.   Pulmonary/Chest: Effort normal and breath sounds normal.  Musculoskeletal: Normal range of motion.       RLE is not shortened or rotated. No visible deformity, swelling or bruising to hip. Tender to palpation posteriorly. No palpable gluteal muscle spasm. Range of motion preserved in hip. LE neurovasc intact with sensory intact to lt touch. Good pedal pulses. Straight leg raise neg. No tenderness to palpation over lumbar spine/paraspinal muscles.  Neurological: She is alert and oriented to person, place, and time. She has normal reflexes.  Skin: Skin is warm and dry. No rash noted. She is not diaphoretic.  Psychiatric: She has a normal mood and affect.    ED Course  Procedures (including critical care time)  Labs Reviewed - No data to display Dg Hip Complete Right  02/22/2011  *RADIOLOGY REPORT*  Clinical Data: Right hip pain, no trauma  RIGHT HIP - COMPLETE 2+ VIEW  Comparison: None.  Findings: No fracture or dislocation is seen.  The visualized bony pelvis appears intact.  Mild degenerative changes of the right hip and lower lumbar spine.  IMPRESSION: No fracture or dislocation is seen.  Mild degenerative changes of the right hip and lower lumbar spine.  Original Report Authenticated By: Charline Bills, M.D.     1. Hip pain, right       MDM  Films negative. Patient able to ambulate, although this is painful. Based on the history obtained from the patient, I suspect that this is most likely a muscle strain. She has some pain medication to take at home as well as a walker; she was instructed to use the pain medication prn and to use the walker for getting around the house for the next few days to prevent a fall. She was also instructed to use ice or a heating pad to the area several times daily. She has a f/u appt scheduled  next week with her PCP and was instructed to keep this. She was instructed regarding signs and sx that would prompt a return visit to the ED. Pt and family verbalized understanding and agreed to plan.        Grant Fontana, [place] 02/23/11 [phone]

## 2011-02-23 NOTE — ED Notes (Signed)
Pt to vehicle via WC, pt and family confirm pt has walker to use at home

## 2011-02-24 NOTE — ED Provider Notes (Signed)
Medical screening examination/treatment/procedure(s) were performed by non-physician practitioner and as supervising physician I was immediately available for consultation/collaboration.   Benny Lennert, MD 02/24/11 [phone]

## 2011-04-03 ENCOUNTER — Ambulatory Visit: Payer: Medicare Other | Admitting: Internal Medicine

## 2011-04-03 ENCOUNTER — Other Ambulatory Visit: Payer: Medicare Other | Admitting: Lab

## 2011-04-12 ENCOUNTER — Other Ambulatory Visit: Payer: Self-pay | Admitting: *Deleted

## 2011-04-12 DIAGNOSIS — C9110 Chronic lymphocytic leukemia of B-cell type not having achieved remission: Secondary | ICD-10-CM

## 2011-04-15 ENCOUNTER — Telehealth: Payer: Self-pay | Admitting: Internal Medicine

## 2011-04-15 ENCOUNTER — Other Ambulatory Visit: Payer: Medicare Other | Admitting: Lab

## 2011-04-15 ENCOUNTER — Ambulatory Visit (HOSPITAL_BASED_OUTPATIENT_CLINIC_OR_DEPARTMENT_OTHER): Payer: Medicare Other | Admitting: Internal Medicine

## 2011-04-15 VITALS — BP 138/69 | HR 75 | Temp 99.0°F | Wt 160.6 lb

## 2011-04-15 DIAGNOSIS — C9110 Chronic lymphocytic leukemia of B-cell type not having achieved remission: Secondary | ICD-10-CM

## 2011-04-15 DIAGNOSIS — Z23 Encounter for immunization: Secondary | ICD-10-CM

## 2011-04-15 LAB — COMPREHENSIVE METABOLIC PANEL
AST: 24 U/L (ref 0–37)
Albumin: 4.1 g/dL (ref 3.5–5.2)
Alkaline Phosphatase: 78 U/L (ref 39–117)
BUN: 12 mg/dL (ref 6–23)
Potassium: 3.7 mEq/L (ref 3.5–5.3)
Sodium: 141 mEq/L (ref 135–145)
Total Bilirubin: 0.3 mg/dL (ref 0.3–1.2)

## 2011-04-15 LAB — CBC WITH DIFFERENTIAL/PLATELET
Basophils Absolute: 0.1 10*3/uL (ref 0.0–0.1)
EOS%: 0.5 % (ref 0.0–7.0)
MCH: 27.7 pg (ref 25.1–34.0)
MCV: 86.8 fL (ref 79.5–101.0)
MONO%: 1.6 % (ref 0.0–14.0)
RBC: 4.05 10*6/uL (ref 3.70–5.45)
RDW: 17 % — ABNORMAL HIGH (ref 11.2–14.5)

## 2011-04-15 MED ORDER — INFLUENZA VIRUS VACC SPLIT PF IM SUSP
0.5000 mL | Freq: Once | INTRAMUSCULAR | Status: AC
  Administered 2011-04-15: 0.5 mL via INTRAMUSCULAR
  Filled 2011-04-15: qty 0.5

## 2011-04-15 NOTE — Progress Notes (Signed)
[facility] OFFICE PROGRESS NOTE  DIAGNOSIS: Stage 0 chronic lymphocytic leukemia diagnosed in August of 2003  PRIOR THERAPY: None  CURRENT THERAPY: Observation.  INTERVAL HISTORY: Heather Avery 78 y.o. female returns to the clinic today for routine six-month followup visit accompanied by her daughter. The patient is feeling fine today and she has no significant complaints. She denied having any significant weight loss or night sweats. No chest pain or shortness of breath. No palpable lymphadenopathy. The patient has repeat CBC and see him at performed recently and she is here today for evaluation and discussion of her lab results.   MEDICAL HISTORY: Past Medical History  Diagnosis Date  . Hypertension   . COPD (chronic obstructive pulmonary disease)   . Leukemia   . Degeneration of intervertebral disc   . Stroke   . Carotid artery occlusion   . Blind right eye   . Hearing loss in left ear   . Skin cancer     ALLERGIES:  is allergic to hydrocodone.  MEDICATIONS:  Current Outpatient Prescriptions  Medication Sig Dispense Refill  . aspirin 81 MG tablet Take 160 mg by mouth daily.        . clopidogrel (PLAVIX) 75 MG tablet Take 75 mg by mouth daily.        Marland Kitchen levothyroxine (SYNTHROID) 100 MCG tablet Take 112 mcg by mouth daily.       Marland Kitchen LORazepam (ATIVAN) 0.5 MG tablet Take 0.5 mg by mouth 3 (three) times daily as needed. For anxiety       . rosuvastatin (CRESTOR) 20 MG tablet Take 20 mg by mouth at bedtime.        Marland Kitchen tiotropium (SPIRIVA HANDIHALER) 18 MCG inhalation capsule Place 18 mcg into inhaler and inhale daily.        Marland Kitchen triamterene-hydrochlorothiazide (DYAZIDE) 37.5-25 MG per capsule Take 1 capsule by mouth every morning.        . venlafaxine (EFFEXOR-XR) 75 MG 24 hr capsule Take 75 mg by mouth at bedtime.        Marland Kitchen HYDROcodone-acetaminophen (NORCO) 5-325 MG per tablet Take 1 tablet by mouth every 6 (six) hours as needed. For pain        Current  Facility-Administered Medications  Medication Dose Route Frequency Provider Last Rate Last Dose  . influenza  inactive virus vaccine (FLUZONE/FLUARIX) injection 0.5 mL  0.5 mL Intramuscular Once Freddie Nghiem K. Arbutus Ped, MD        REVIEW OF SYSTEMS:  A comprehensive review of systems was negative.   PHYSICAL EXAMINATION: General appearance: alert, cooperative and no distress Head: Normocephalic, without obvious abnormality, atraumatic Neck: no adenopathy Lymph nodes: Cervical, supraclavicular, and axillary nodes normal. Resp: clear to auscultation bilaterally Cardio: regular rate and rhythm, S1, S2 normal, no murmur, click, rub or gallop GI: soft, non-tender; bowel sounds normal; no masses,  no organomegaly Extremities: extremities normal, atraumatic, no cyanosis or edema Neurologic: Alert and oriented X 3, normal strength and tone. Normal symmetric reflexes. Normal coordination and gait  ECOG PERFORMANCE STATUS: 0 - Asymptomatic  Blood pressure 138/69, pulse 75, temperature 99 F (37.2 C), temperature source Oral, weight 160 lb 9.6 oz (72.848 kg).  LABORATORY DATA: Lab Results  Component Value Date   WBC 36.5* 04/15/2011   HGB 11.2* 04/15/2011   HCT 35.1 04/15/2011   MCV 86.8 04/15/2011   PLT 237 04/15/2011      Chemistry      Component Value Date/Time   NA 141 04/15/2011 1504  K 3.7 04/15/2011 1504   CL 100 04/15/2011 1504   CO2 32 04/15/2011 1504   BUN 12 04/15/2011 1504   CREATININE 0.78 04/15/2011 1504      Component Value Date/Time   CALCIUM 9.4 04/15/2011 1504   ALKPHOS 78 04/15/2011 1504   AST 24 04/15/2011 1504   ALT 18 04/15/2011 1504   BILITOT 0.3 04/15/2011 1504      ASSESSMENT: This is a very pleasant 78 years old white female with stage 0 chronic lymphocytic leukemia. She is currently on observation and no significant evidence for disease progression. I discussed the lab result with the patient today.   PLAN: I recommended for her continuous observation for now. I would see her back  for followup visit in 6 months with repeat CBC, comprehensive metabolic panel and LDH. The patient was advised to call me immediately if she has any worsening symptoms in the interval. She also received the flu shot today.   All questions were answered. The patient knows to call the clinic with any problems, questions or concerns. We can certainly see the patient much sooner if necessary.

## 2011-04-15 NOTE — Telephone Encounter (Signed)
appt made for 10/14/11 and printed for pt  Heather Avery

## 2011-09-16 ENCOUNTER — Telehealth: Payer: Self-pay | Admitting: Internal Medicine

## 2011-09-16 NOTE — Telephone Encounter (Signed)
called daughter back and moved her appt from 7/8 to 7/9   aom

## 2011-10-14 ENCOUNTER — Other Ambulatory Visit: Payer: Medicare Other | Admitting: Lab

## 2011-10-14 ENCOUNTER — Ambulatory Visit: Payer: Medicare Other | Admitting: Internal Medicine

## 2011-10-15 ENCOUNTER — Ambulatory Visit (HOSPITAL_BASED_OUTPATIENT_CLINIC_OR_DEPARTMENT_OTHER): Payer: Medicare Other | Admitting: Internal Medicine

## 2011-10-15 ENCOUNTER — Other Ambulatory Visit (HOSPITAL_BASED_OUTPATIENT_CLINIC_OR_DEPARTMENT_OTHER): Payer: Medicare Other | Admitting: Lab

## 2011-10-15 ENCOUNTER — Telehealth: Payer: Self-pay | Admitting: Internal Medicine

## 2011-10-15 VITALS — BP 119/69 | HR 78 | Temp 98.4°F | Ht 66.5 in | Wt 161.4 lb

## 2011-10-15 DIAGNOSIS — C9110 Chronic lymphocytic leukemia of B-cell type not having achieved remission: Secondary | ICD-10-CM

## 2011-10-15 LAB — COMPREHENSIVE METABOLIC PANEL
Alkaline Phosphatase: 72 U/L (ref 39–117)
BUN: 10 mg/dL (ref 6–23)
CO2: 27 mEq/L (ref 19–32)
Creatinine, Ser: 0.77 mg/dL (ref 0.50–1.10)
Glucose, Bld: 97 mg/dL (ref 70–99)
Total Bilirubin: 0.4 mg/dL (ref 0.3–1.2)

## 2011-10-15 LAB — CBC WITH DIFFERENTIAL/PLATELET
Basophils Absolute: 0 10*3/uL (ref 0.0–0.1)
Eosinophils Absolute: 0 10*3/uL (ref 0.0–0.5)
HCT: 37.9 % (ref 34.8–46.6)
LYMPH%: 79.5 % — ABNORMAL HIGH (ref 14.0–49.7)
MCV: 85.5 fL (ref 79.5–101.0)
MONO%: 2.7 % (ref 0.0–14.0)
NEUT#: 5.3 10*3/uL (ref 1.5–6.5)
NEUT%: 17.6 % — ABNORMAL LOW (ref 38.4–76.8)
Platelets: 226 10*3/uL (ref 145–400)
RBC: 4.43 10*6/uL (ref 3.70–5.45)

## 2011-10-15 LAB — TECHNOLOGIST REVIEW

## 2011-10-15 LAB — LACTATE DEHYDROGENASE: LDH: 224 U/L (ref 94–250)

## 2011-10-15 NOTE — Progress Notes (Signed)
[facility] [phone]   Fax:(336) [phone]  OFFICE PROGRESS NOTE   DIAGNOSIS: Stage 0 chronic lymphocytic leukemia diagnosed in August of 2003   PRIOR THERAPY: None   CURRENT THERAPY: Observation.   INTERVAL HISTORY: Heather Avery 78 y.o. female returns to the clinic today for six-month followup visit accompanied by her daughter. The patient is doing fine today with no specific complaints. She denied having any significant weight loss or night sweats. He has no chest pain or shortness of breath, no cough or hemoptysis. The patient has repeat CBC performed earlier today and she is here for evaluation and discussion of her lab results.  MEDICAL HISTORY: Past Medical History  Diagnosis Date  . Hypertension   . COPD (chronic obstructive pulmonary disease)   . Leukemia   . Degeneration of intervertebral disc   . Stroke   . Carotid artery occlusion   . Blind right eye   . Hearing loss in left ear   . Skin cancer     ALLERGIES:  is allergic to hydrocodone.  MEDICATIONS:  Current Outpatient Prescriptions  Medication Sig Dispense Refill  . aspirin 81 MG tablet Take 160 mg by mouth daily.        . clopidogrel (PLAVIX) 75 MG tablet Take 75 mg by mouth daily.        Marland Kitchen HYDROcodone-acetaminophen (NORCO) 5-325 MG per tablet Take 1 tablet by mouth every 6 (six) hours as needed. For pain       . levothyroxine (SYNTHROID) 100 MCG tablet Take 112 mcg by mouth daily.       Marland Kitchen LORazepam (ATIVAN) 0.5 MG tablet Take 0.5 mg by mouth 3 (three) times daily as needed. For anxiety       . rosuvastatin (CRESTOR) 20 MG tablet Take 20 mg by mouth at bedtime.        Marland Kitchen tiotropium (SPIRIVA HANDIHALER) 18 MCG inhalation capsule Place 18 mcg into inhaler and inhale daily.        Marland Kitchen triamterene-hydrochlorothiazide (DYAZIDE) 37.5-25 MG per capsule Take 1 capsule by mouth every morning.        . venlafaxine (EFFEXOR-XR) 75 MG 24 hr capsule Take 75 mg by mouth at bedtime.            REVIEW OF SYSTEMS:  A comprehensive review of systems was negative.   PHYSICAL EXAMINATION: General appearance: alert, cooperative and no distress Neck: no adenopathy Lymph nodes: Cervical, supraclavicular, and axillary nodes normal. Resp: clear to auscultation bilaterally Cardio: regular rate and rhythm, S1, S2 normal, no murmur, click, rub or gallop GI: soft, non-tender; bowel sounds normal; no masses,  no organomegaly Extremities: extremities normal, atraumatic, no cyanosis or edema Neurologic: Alert and oriented X 3, normal strength and tone. Normal symmetric reflexes. Normal coordination and gait  ECOG PERFORMANCE STATUS: 1 - Symptomatic but completely ambulatory  Blood pressure 119/69, pulse 78, temperature 98.4 F (36.9 C), temperature source Oral, height 5' 6.5" (1.689 m), weight 161 lb 6.4 oz (73.211 kg).  LABORATORY DATA: Lab Results  Component Value Date   WBC 30.3* 10/15/2011   HGB 11.9 10/15/2011   HCT 37.9 10/15/2011   MCV 85.5 10/15/2011   PLT 226 10/15/2011      Chemistry      Component Value Date/Time   NA 141 04/15/2011 1504   K 3.7 04/15/2011 1504   CL 100 04/15/2011 1504   CO2 32 04/15/2011 1504   BUN 12 04/15/2011 1504   CREATININE  0.78 04/15/2011 1504      Component Value Date/Time   CALCIUM 9.4 04/15/2011 1504   ALKPHOS 78 04/15/2011 1504   AST 24 04/15/2011 1504   ALT 18 04/15/2011 1504   BILITOT 0.3 04/15/2011 1504       RADIOGRAPHIC STUDIES: No results found.  ASSESSMENT: This is a very pleasant 78 years old white female with chronic lymphocytic leukemia, stage 0. The patient is doing fine and no significant increase in her total leukocytic count and no other symptoms.  PLAN: I discussed the lab result with the patient and her daughter. I recommended for her continuous observation for now with repeat CBC and LDH in 6 months. She was advised to call me immediately if she has any concerning symptoms in the interval.  All questions were answered. The patient knows  to call the clinic with any problems, questions or concerns. We can certainly see the patient much sooner if necessary.

## 2011-10-15 NOTE — Telephone Encounter (Signed)
Pt gave appt for December 2013 lab and MD

## 2012-03-16 ENCOUNTER — Telehealth: Payer: Self-pay | Admitting: Internal Medicine

## 2012-03-16 ENCOUNTER — Other Ambulatory Visit (HOSPITAL_BASED_OUTPATIENT_CLINIC_OR_DEPARTMENT_OTHER): Payer: Medicare Other | Admitting: Lab

## 2012-03-16 ENCOUNTER — Ambulatory Visit (HOSPITAL_BASED_OUTPATIENT_CLINIC_OR_DEPARTMENT_OTHER): Payer: Medicare Other | Admitting: Internal Medicine

## 2012-03-16 VITALS — BP 154/77 | HR 68 | Temp 97.1°F | Resp 20 | Ht 66.5 in | Wt 159.3 lb

## 2012-03-16 DIAGNOSIS — C9110 Chronic lymphocytic leukemia of B-cell type not having achieved remission: Secondary | ICD-10-CM

## 2012-03-16 LAB — COMPREHENSIVE METABOLIC PANEL (CC13)
ALT: 14 U/L (ref 0–55)
AST: 20 U/L (ref 5–34)
Albumin: 4.2 g/dL (ref 3.5–5.0)
BUN: 10 mg/dL (ref 7.0–26.0)
CO2: 32 mEq/L — ABNORMAL HIGH (ref 22–29)
Calcium: 9.5 mg/dL (ref 8.4–10.4)
Chloride: 98 mEq/L (ref 98–107)
Creatinine: 0.8 mg/dL (ref 0.6–1.1)
Potassium: 3.9 mEq/L (ref 3.5–5.1)

## 2012-03-16 LAB — CBC WITH DIFFERENTIAL/PLATELET
BASO%: 0.2 % (ref 0.0–2.0)
Basophils Absolute: 0.1 10*3/uL (ref 0.0–0.1)
HCT: 39.3 % (ref 34.8–46.6)
HGB: 12.6 g/dL (ref 11.6–15.9)
MONO#: 0.7 10*3/uL (ref 0.1–0.9)
NEUT%: 10.9 % — ABNORMAL LOW (ref 38.4–76.8)
RDW: 15.9 % — ABNORMAL HIGH (ref 11.2–14.5)
WBC: 30.6 10*3/uL — ABNORMAL HIGH (ref 3.9–10.3)
lymph#: 26.5 10*3/uL — ABNORMAL HIGH (ref 0.9–3.3)

## 2012-03-16 LAB — LACTATE DEHYDROGENASE (CC13): LDH: 184 U/L (ref 125–245)

## 2012-03-16 NOTE — Telephone Encounter (Signed)
appts made and printed for pt anne °

## 2012-03-16 NOTE — Patient Instructions (Addendum)
Your CBC is stable today. Continue on observation with repeat CBC, comprehensive metabolic panel and LDH in 6 months

## 2012-03-16 NOTE — Progress Notes (Signed)
[facility] [phone]   Fax:([phone]  OFFICE PROGRESS NOTE  DIAGNOSIS: Stage 0 chronic lymphocytic leukemia diagnosed in August of 2003   PRIOR THERAPY: None   CURRENT THERAPY: Observation.  INTERVAL HISTORY: Heather Avery 78 y.o. female returns to the clinic today for six-month followup visit accompanied her daughter. The patient is feeling fine today with no specific complaints. She lost few pounds recently. She denied having any significant night sweats. The patient denied having any palpable lymphadenopathy. She has no significant chest pain, shortness breath, cough or hemoptysis. She has repeat CBC performed earlier today and she is here for evaluation and discussion of her lab results.  MEDICAL HISTORY: Past Medical History  Diagnosis Date  . Hypertension   . COPD (chronic obstructive pulmonary disease)   . Leukemia   . Degeneration of intervertebral disc   . Stroke   . Carotid artery occlusion   . Blind right eye   . Hearing loss in left ear   . Skin cancer     ALLERGIES:  is allergic to hydrocodone.  MEDICATIONS:  Current Outpatient Prescriptions  Medication Sig Dispense Refill  . aspirin 81 MG tablet Take 160 mg by mouth daily.        . clopidogrel (PLAVIX) 75 MG tablet Take 75 mg by mouth daily.        Marland Kitchen HYDROcodone-acetaminophen (NORCO) 5-325 MG per tablet Take 1 tablet by mouth every 6 (six) hours as needed. For pain       . levothyroxine (SYNTHROID) 100 MCG tablet Take 112 mcg by mouth daily.       Marland Kitchen LORazepam (ATIVAN) 0.5 MG tablet Take 0.5 mg by mouth 3 (three) times daily as needed. For anxiety       . rosuvastatin (CRESTOR) 20 MG tablet Take 20 mg by mouth at bedtime.        Marland Kitchen tiotropium (SPIRIVA HANDIHALER) 18 MCG inhalation capsule Place 18 mcg into inhaler and inhale daily.        Marland Kitchen triamterene-hydrochlorothiazide (DYAZIDE) 37.5-25 MG per capsule Take 1 capsule by mouth every morning.        . venlafaxine  (EFFEXOR-XR) 75 MG 24 hr capsule Take 75 mg by mouth at bedtime.          REVIEW OF SYSTEMS:  A comprehensive review of systems was negative.   PHYSICAL EXAMINATION: General appearance: alert, cooperative and no distress Head: Normocephalic, without obvious abnormality, atraumatic Neck: no adenopathy Lymph nodes: Cervical, supraclavicular, and axillary nodes normal. Resp: clear to auscultation bilaterally Cardio: regular rate and rhythm, S1, S2 normal, no murmur, click, rub or gallop GI: soft, non-tender; bowel sounds normal; no masses,  no organomegaly Extremities: extremities normal, atraumatic, no cyanosis or edema  ECOG PERFORMANCE STATUS: 1 - Symptomatic but completely ambulatory  There were no vitals taken for this visit.  LABORATORY DATA: Lab Results  Component Value Date   WBC 30.6* 03/16/2012   HGB 12.6 03/16/2012   HCT 39.3 03/16/2012   MCV 89.5 03/16/2012   PLT 204 03/16/2012      Chemistry      Component Value Date/Time   NA 137 10/15/2011 1501   K 4.0 10/15/2011 1501   CL 98 10/15/2011 1501   CO2 27 10/15/2011 1501   BUN 10 10/15/2011 1501   CREATININE 0.77 10/15/2011 1501      Component Value Date/Time   CALCIUM 9.5 10/15/2011 1501   ALKPHOS 72 10/15/2011 1501   AST  25 10/15/2011 1501   ALT 10 10/15/2011 1501   BILITOT 0.4 10/15/2011 1501       RADIOGRAPHIC STUDIES: No results found.  ASSESSMENT: This is a very pleasant 78 years old white female with history of stage 0 chronic lymphocytic leukemia currently on observation. The patient has no evidence for disease progression on his recent blood work.  PLAN: I recommended for her to continue on observation for now. I would see her back for followup visit in 6 months with repeat CBC, comprehensive metabolic panel and LDH. She was advised to call immediately if she has any concerning symptoms in the interval.  All questions were answered. The patient knows to call the clinic with any problems, questions or concerns. We can  certainly see the patient much sooner if necessary.

## 2012-03-17 ENCOUNTER — Telehealth: Payer: Self-pay | Admitting: *Deleted

## 2012-03-17 NOTE — Telephone Encounter (Signed)
Left voice message to inform the patient of the new date and time in 09-14-2012 starting at 3:00pm with labs

## 2012-09-14 ENCOUNTER — Telehealth: Payer: Self-pay | Admitting: Internal Medicine

## 2012-09-14 ENCOUNTER — Other Ambulatory Visit (HOSPITAL_BASED_OUTPATIENT_CLINIC_OR_DEPARTMENT_OTHER): Payer: Medicare Other | Admitting: Lab

## 2012-09-14 ENCOUNTER — Ambulatory Visit (HOSPITAL_BASED_OUTPATIENT_CLINIC_OR_DEPARTMENT_OTHER): Payer: Medicare Other | Admitting: Internal Medicine

## 2012-09-14 ENCOUNTER — Encounter: Payer: Self-pay | Admitting: Internal Medicine

## 2012-09-14 VITALS — BP 126/69 | HR 75 | Temp 97.3°F | Resp 18 | Ht 66.0 in | Wt 159.3 lb

## 2012-09-14 DIAGNOSIS — C9110 Chronic lymphocytic leukemia of B-cell type not having achieved remission: Secondary | ICD-10-CM

## 2012-09-14 LAB — CBC WITH DIFFERENTIAL/PLATELET
BASO%: 0.4 % (ref 0.0–2.0)
EOS%: 0.2 % (ref 0.0–7.0)
LYMPH%: 86.2 % — ABNORMAL HIGH (ref 14.0–49.7)
MCH: 29.7 pg (ref 25.1–34.0)
MCHC: 33.7 g/dL (ref 31.5–36.0)
MCV: 88.4 fL (ref 79.5–101.0)
MONO%: 2.5 % (ref 0.0–14.0)
NEUT#: 3.6 10*3/uL (ref 1.5–6.5)
RBC: 4.4 10*6/uL (ref 3.70–5.45)
RDW: 15.1 % — ABNORMAL HIGH (ref 11.2–14.5)

## 2012-09-14 LAB — COMPREHENSIVE METABOLIC PANEL (CC13)
AST: 19 U/L (ref 5–34)
Albumin: 4 g/dL (ref 3.5–5.0)
Alkaline Phosphatase: 69 U/L (ref 40–150)
Potassium: 3.3 mEq/L — ABNORMAL LOW (ref 3.5–5.1)
Sodium: 140 mEq/L (ref 136–145)
Total Bilirubin: 0.41 mg/dL (ref 0.20–1.20)
Total Protein: 7.1 g/dL (ref 6.4–8.3)

## 2012-09-14 NOTE — Patient Instructions (Signed)
Continue on observation.  Followup in 6 months with repeat CBC, comprehensive metabolic panel and LDH.

## 2012-09-14 NOTE — Telephone Encounter (Signed)
gv and printed appt sched and avs to pt. °

## 2012-09-14 NOTE — Progress Notes (Signed)
[facility] [phone]   Fax:(336) [phone]  OFFICE PROGRESS NOTE  DIAGNOSIS: Stage 0 chronic lymphocytic leukemia diagnosed in August of 2003   PRIOR THERAPY: None   CURRENT THERAPY: Observation.  INTERVAL HISTORY: Heather Avery 79 y.o. female returns to the clinic today for visit accompanied by her daughter. The patient is feeling fine today with no specific complaints. She denied having any significant weight loss or night sweats. She has no bleeding issues. She denied having any significant chest pain but continues to have shortness breath with exertion, no cough or hemoptysis. The patient has repeat CBC performed earlier today and she is here for evaluation and discussion of her lab results.  MEDICAL HISTORY: Past Medical History  Diagnosis Date  . Hypertension   . COPD (chronic obstructive pulmonary disease)   . Leukemia   . Degeneration of intervertebral disc   . Stroke   . Carotid artery occlusion   . Blind right eye   . Hearing loss in left ear   . Skin cancer     ALLERGIES:  is allergic to hydrocodone.  MEDICATIONS:  Current Outpatient Prescriptions  Medication Sig Dispense Refill  . aspirin 81 MG tablet Take 160 mg by mouth daily.        . clopidogrel (PLAVIX) 75 MG tablet Take 75 mg by mouth daily.        Marland Kitchen levothyroxine (SYNTHROID) 100 MCG tablet Take 112 mcg by mouth daily.       . rosuvastatin (CRESTOR) 20 MG tablet Take 20 mg by mouth at bedtime.        Marland Kitchen tiotropium (SPIRIVA HANDIHALER) 18 MCG inhalation capsule Place 18 mcg into inhaler and inhale daily.        Marland Kitchen triamterene-hydrochlorothiazide (DYAZIDE) 37.5-25 MG per capsule Take 1 capsule by mouth every morning.        . venlafaxine (EFFEXOR-XR) 75 MG 24 hr capsule Take 75 mg by mouth at bedtime.        Marland Kitchen HYDROcodone-acetaminophen (NORCO) 5-325 MG per tablet Take 1 tablet by mouth every 6 (six) hours as needed. For pain       . LORazepam (ATIVAN) 0.5 MG tablet Take 0.5 mg  by mouth 3 (three) times daily as needed. For anxiety        No current facility-administered medications for this visit.    REVIEW OF SYSTEMS:  A comprehensive review of systems was negative.   PHYSICAL EXAMINATION: General appearance: alert, cooperative and no distress Head: Normocephalic, without obvious abnormality, atraumatic Neck: no adenopathy Lymph nodes: Cervical, supraclavicular, and axillary nodes normal. Resp: clear to auscultation bilaterally Cardio: regular rate and rhythm, S1, S2 normal, no murmur, click, rub or gallop GI: soft, non-tender; bowel sounds normal; no masses,  no organomegaly Extremities: extremities normal, atraumatic, no cyanosis or edema  ECOG PERFORMANCE STATUS: 0 - Asymptomatic  Blood pressure 126/69, pulse 75, temperature 97.3 F (36.3 C), temperature source Oral, resp. rate 18, height 5\' 6"  (1.676 m), weight 159 lb 4.8 oz (72.258 kg).  LABORATORY DATA: Lab Results  Component Value Date   WBC 33.2* 09/14/2012   HGB 13.1 09/14/2012   HCT 38.8 09/14/2012   MCV 88.4 09/14/2012   PLT 169 09/14/2012      Chemistry      Component Value Date/Time   NA 140 03/16/2012 1454   NA 137 10/15/2011 1501   K 3.9 03/16/2012 1454   K 4.0 10/15/2011 1501   CL 98 03/16/2012  1454   CL 98 10/15/2011 1501   CO2 32* 03/16/2012 1454   CO2 27 10/15/2011 1501   BUN 10.0 03/16/2012 1454   BUN 10 10/15/2011 1501   CREATININE 0.8 03/16/2012 1454   CREATININE 0.77 10/15/2011 1501      Component Value Date/Time   CALCIUM 9.5 03/16/2012 1454   CALCIUM 9.5 10/15/2011 1501   ALKPHOS 69 03/16/2012 1454   ALKPHOS 72 10/15/2011 1501   AST 20 03/16/2012 1454   AST 25 10/15/2011 1501   ALT 14 03/16/2012 1454   ALT 10 10/15/2011 1501   BILITOT 0.44 03/16/2012 1454   BILITOT 0.4 10/15/2011 1501       RADIOGRAPHIC STUDIES: No results found.  ASSESSMENT: This is a very pleasant 79 years old white female with history of chronic lymphocytic leukemia on observation with no evidence for disease  progression.   PLAN: I discussed the lab result with the patient today and recommended for her to continue on observation. I would see her back for followup visit in 6 months with repeat CBC, comprehensive metabolic panel and LDH. She was advised to call immediately if she has any concerning symptoms in the interval.  All questions were answered. The patient knows to call the clinic with any problems, questions or concerns. We can certainly see the patient much sooner if necessary.

## 2012-11-02 IMAGING — CT CT CERVICAL SPINE W/O CM
4 of 6 series · 14 of 33 positions shown, 16 images · non-contrast
Comparison: MRI 06/05/2008

CT HEAD

CLINICAL DATA: Fall

CT HEAD WITHOUT CONTRAST
CT CERVICAL SPINE WITHOUT CONTRAST
TECHNIQUE: Multidetector CT imaging of the head and cervical spine
was performed following the standard protocol without intravenous
contrast.  Multiplanar CT image reconstructions of the cervical
spine were also generated.

[Series 5: soft tissue · axial · 0.34mm/px · z∈[+68,+168]mm · 3 of 100 slices shown]
[im 25/100  soft-tissue]
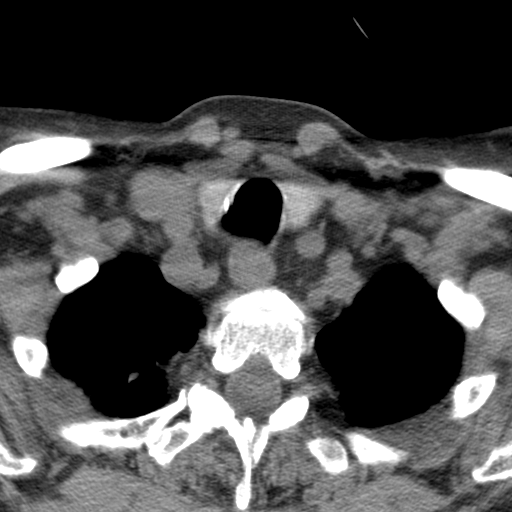
[im 50/100  soft-tissue]
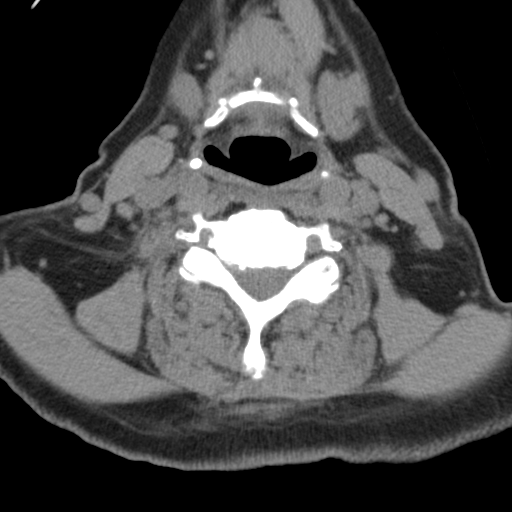
[im 75/100  soft-tissue]
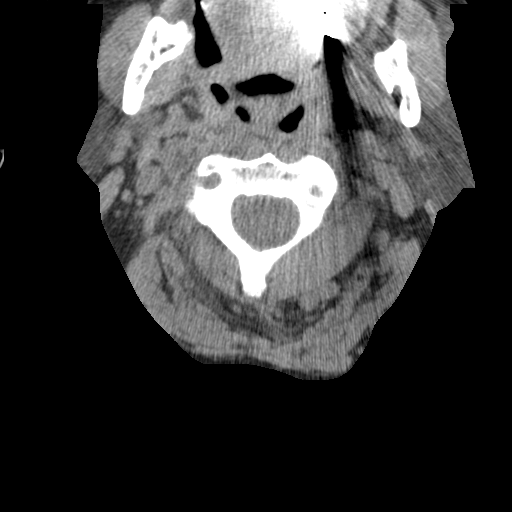

[mpr, sagittal, sagittal · sagittal · 0.39mm/px · 5 of 47 slices shown, 6 images]
[im 16/47  bone]
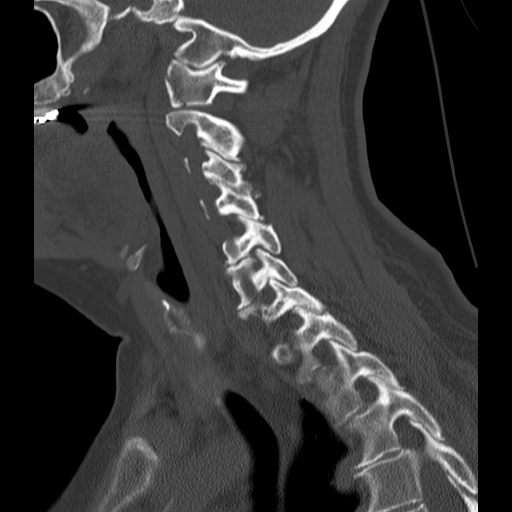
[im 20/47  bone]
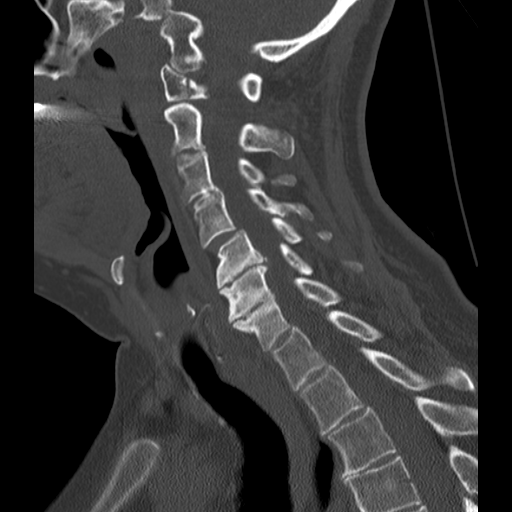
[im 24/47  soft-tissue]
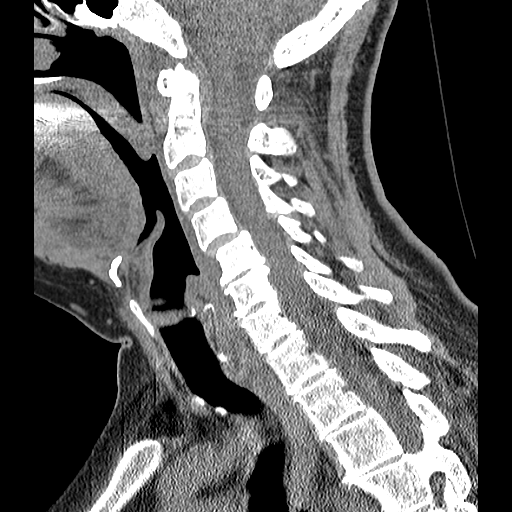
[im 24/47  bone]
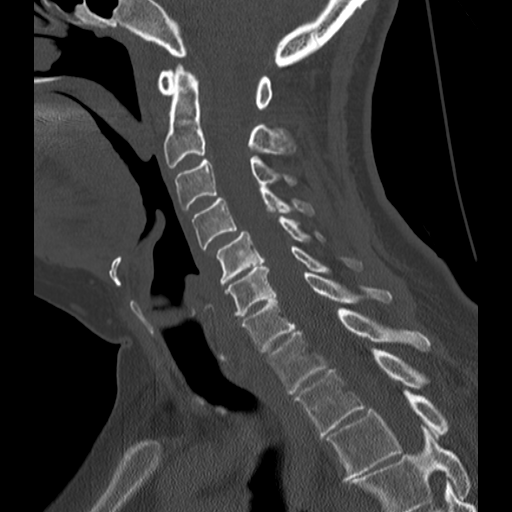
[im 27/47  bone]
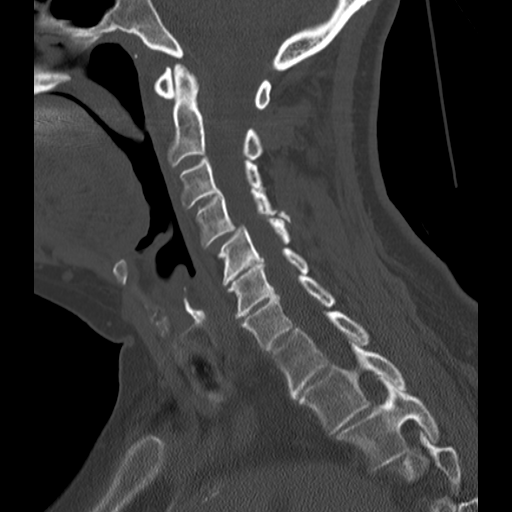
[im 31/47  bone]
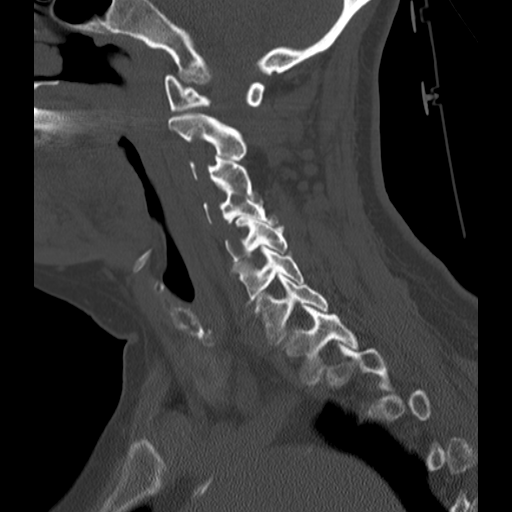

[mpr, coronal, coronal · coronal · 0.39mm/px · 3 of 73 slices shown]
[im 15/73  bone]
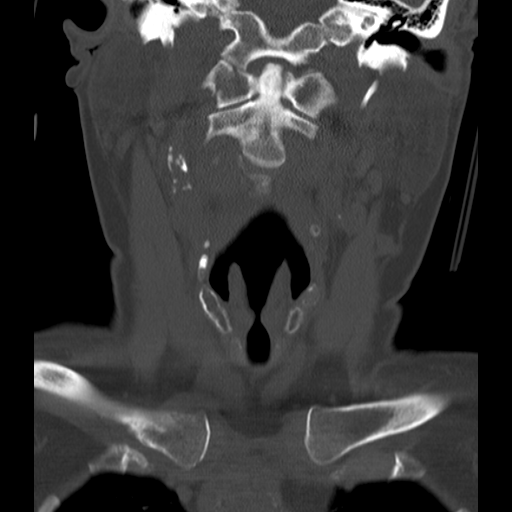
[im 29/73  bone]
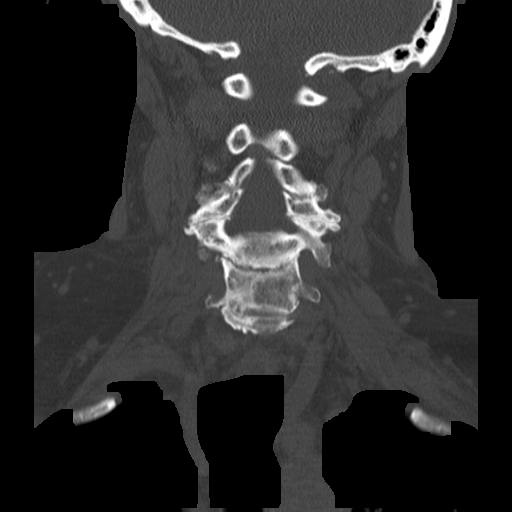
[im 44/73  bone]
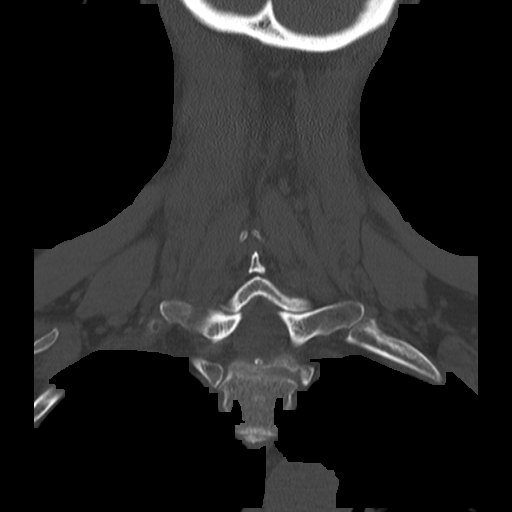

[angles · axial · 0.34mm/px · z∈[+83,+171]mm · 3 of 91 slices shown, 4 images]
[im 23/91  soft-tissue]
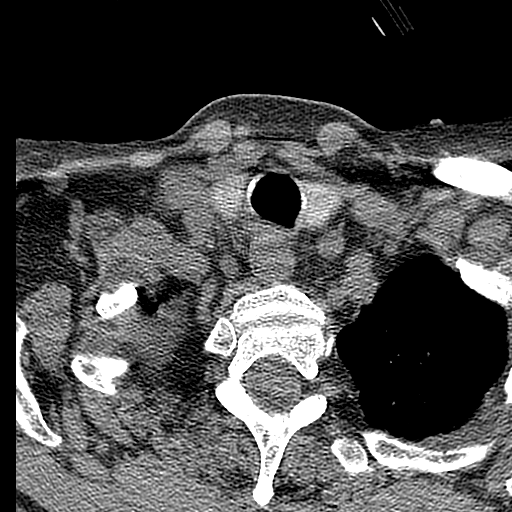
[im 23/91  bone]
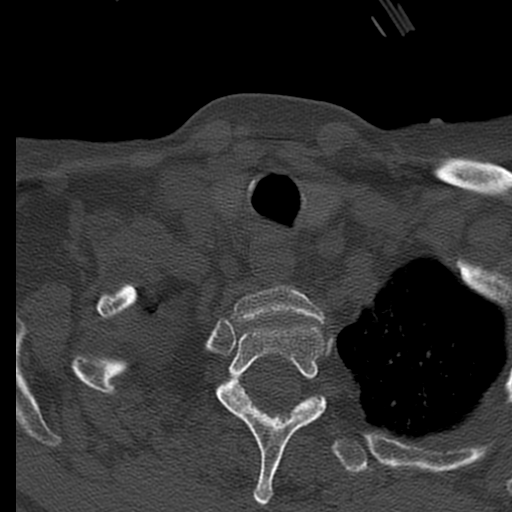
[im 46/91  bone]
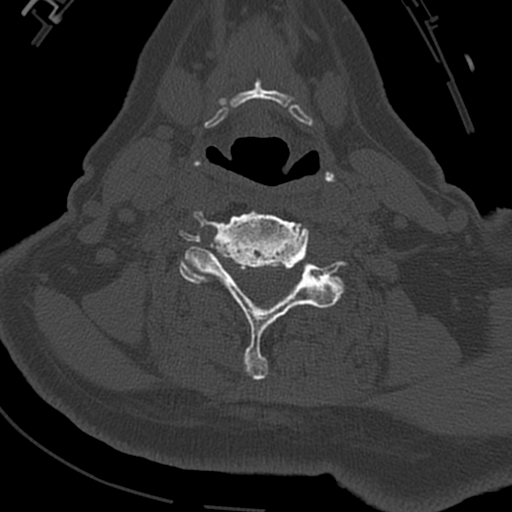
[im 68/91  bone]
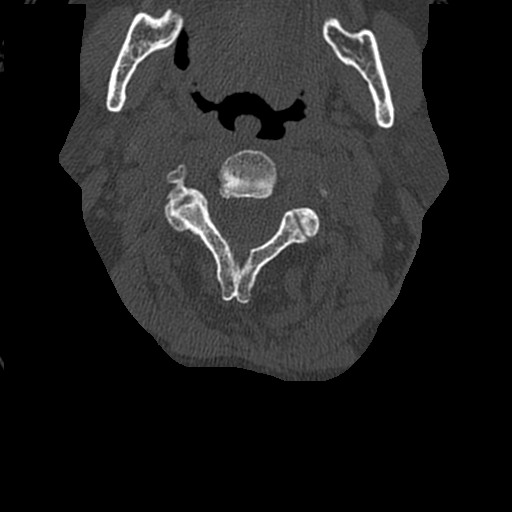

[14 of 33 positions shown; findings below may reference images not displayed]

FINDINGS: Chronic infarcts in the right frontal lobe and right
parietal lobe. Negative for acute infarct.  Negative for hemorrhage
or mass.  Ventricle size is normal.  Calvarium is intact.
IMPRESSION: Chronic infarcts in the right frontal lobe and right parietal lobe.
No acute abnormality.

CT CERVICAL SPINE
FINDINGS: Acute fracture of the body of the C1 vertebral body on
the right.  This is not significantly displaced.  No other
fractures identified.  Moderate facet degeneration on the right at
C2-3 and C3-4.  Extensive facet degeneration at C4-5 with 3 mm of
anterior slip disc degeneration and spondylosis at C5-6 and C6-7.

Atheromatous calcification in the carotid artery bilaterally.
IMPRESSION: Acute nondisplaced fracture  of the C1 vertebral body on the right.

Disc degeneration and facet degeneration in the cervical spine.
Advanced spondylosis at C5-6 and moderate spondylosis at C6-7.  3
mm anterior slip C4-C5 is felt to be due to facet degeneration.

Critical Value/emergent results were called by telephone at the
time of interpretation on 01/01/2011  at 5581 hours  to  Dr.
Yosvani, who verbally acknowledged these results.

## 2013-03-15 ENCOUNTER — Telehealth: Payer: Self-pay | Admitting: Internal Medicine

## 2013-03-15 NOTE — Telephone Encounter (Signed)
s.w. pt daughter and she needed to r/s appt due to mother being sick...done

## 2013-03-16 ENCOUNTER — Other Ambulatory Visit: Payer: Medicare Other | Admitting: Lab

## 2013-03-16 ENCOUNTER — Ambulatory Visit: Payer: Medicare Other | Admitting: Internal Medicine

## 2013-03-29 ENCOUNTER — Ambulatory Visit (HOSPITAL_BASED_OUTPATIENT_CLINIC_OR_DEPARTMENT_OTHER): Payer: Medicare Other | Admitting: Internal Medicine

## 2013-03-29 ENCOUNTER — Telehealth: Payer: Self-pay | Admitting: Internal Medicine

## 2013-03-29 ENCOUNTER — Other Ambulatory Visit (HOSPITAL_BASED_OUTPATIENT_CLINIC_OR_DEPARTMENT_OTHER): Payer: Medicare Other

## 2013-03-29 ENCOUNTER — Other Ambulatory Visit: Payer: Self-pay | Admitting: *Deleted

## 2013-03-29 ENCOUNTER — Encounter: Payer: Self-pay | Admitting: Internal Medicine

## 2013-03-29 VITALS — BP 145/70 | HR 70 | Temp 97.8°F | Resp 20 | Ht 66.0 in | Wt 153.2 lb

## 2013-03-29 DIAGNOSIS — I10 Essential (primary) hypertension: Secondary | ICD-10-CM

## 2013-03-29 DIAGNOSIS — C9110 Chronic lymphocytic leukemia of B-cell type not having achieved remission: Secondary | ICD-10-CM

## 2013-03-29 DIAGNOSIS — J449 Chronic obstructive pulmonary disease, unspecified: Secondary | ICD-10-CM

## 2013-03-29 LAB — CBC WITH DIFFERENTIAL/PLATELET
Basophils Absolute: 0.1 10*3/uL (ref 0.0–0.1)
Eosinophils Absolute: 0.1 10*3/uL (ref 0.0–0.5)
HCT: 41 % (ref 34.8–46.6)
HGB: 13.3 g/dL (ref 11.6–15.9)
LYMPH%: 83.5 % — ABNORMAL HIGH (ref 14.0–49.7)
MONO#: 0.9 10*3/uL (ref 0.1–0.9)
NEUT#: 4.5 10*3/uL (ref 1.5–6.5)
NEUT%: 13.5 % — ABNORMAL LOW (ref 38.4–76.8)
Platelets: 240 10*3/uL (ref 145–400)
WBC: 33.3 10*3/uL — ABNORMAL HIGH (ref 3.9–10.3)
lymph#: 27.8 10*3/uL — ABNORMAL HIGH (ref 0.9–3.3)

## 2013-03-29 LAB — COMPREHENSIVE METABOLIC PANEL (CC13)
Anion Gap: 10 mEq/L (ref 3–11)
CO2: 33 mEq/L — ABNORMAL HIGH (ref 22–29)
Calcium: 9.6 mg/dL (ref 8.4–10.4)
Chloride: 97 mEq/L — ABNORMAL LOW (ref 98–109)
Creatinine: 0.8 mg/dL (ref 0.6–1.1)
Glucose: 114 mg/dl (ref 70–140)
Total Bilirubin: 0.52 mg/dL (ref 0.20–1.20)

## 2013-03-29 LAB — LACTATE DEHYDROGENASE (CC13): LDH: 171 U/L (ref 125–245)

## 2013-03-29 NOTE — Telephone Encounter (Signed)
gv adn printed appt sched and avs for pt for June 2015  °

## 2013-03-29 NOTE — Progress Notes (Signed)
[facility] Telephone:(336) [phone]   Fax:(336) [phone]  OFFICE PROGRESS NOTE  DIAGNOSIS: Stage 0 chronic lymphocytic leukemia diagnosed in August of 2003   PRIOR THERAPY: None   CURRENT THERAPY: Observation.  INTERVAL HISTORY: Heather Avery 79 y.o. female returns to the clinic today for visit accompanied by her daughter. The patient is feeling fine today with no specific complaints but she is recovering from her recent flulike symptoms. She denied having any significant weight loss or night sweats. She has no bleeding issues. She denied having any significant chest pain, shortness of breath, no cough or hemoptysis. The patient has repeat CBC performed earlier today and she is here for evaluation and discussion of her lab results.  MEDICAL HISTORY: Past Medical History  Diagnosis Date  . Hypertension   . COPD (chronic obstructive pulmonary disease)   . Leukemia   . Degeneration of intervertebral disc   . Stroke   . Carotid artery occlusion   . Blind right eye   . Hearing loss in left ear   . Skin cancer     ALLERGIES:  is allergic to hydrocodone.  MEDICATIONS:  Current Outpatient Prescriptions  Medication Sig Dispense Refill  . aspirin 81 MG tablet Take 160 mg by mouth daily.        . clopidogrel (PLAVIX) 75 MG tablet Take 75 mg by mouth daily.        Marland Kitchen HYDROcodone-acetaminophen (NORCO) 5-325 MG per tablet Take 1 tablet by mouth every 6 (six) hours as needed. For pain       . levothyroxine (SYNTHROID) 100 MCG tablet Take 112 mcg by mouth daily.       Marland Kitchen LORazepam (ATIVAN) 0.5 MG tablet Take 0.5 mg by mouth 3 (three) times daily as needed. For anxiety       . rosuvastatin (CRESTOR) 20 MG tablet Take 20 mg by mouth at bedtime.        Marland Kitchen tiotropium (SPIRIVA HANDIHALER) 18 MCG inhalation capsule Place 18 mcg into inhaler and inhale daily.        Marland Kitchen triamterene-hydrochlorothiazide (DYAZIDE) 37.5-25 MG per capsule Take 1 capsule by mouth every morning.        .  venlafaxine (EFFEXOR-XR) 75 MG 24 hr capsule Take 75 mg by mouth at bedtime.         No current facility-administered medications for this visit.    REVIEW OF SYSTEMS:  A comprehensive review of systems was negative.   PHYSICAL EXAMINATION: General appearance: alert, cooperative and no distress Head: Normocephalic, without obvious abnormality, atraumatic Neck: no adenopathy Lymph nodes: Cervical, supraclavicular, and axillary nodes normal. Resp: clear to auscultation bilaterally Cardio: regular rate and rhythm, S1, S2 normal, no murmur, click, rub or gallop GI: soft, non-tender; bowel sounds normal; no masses,  no organomegaly Extremities: extremities normal, atraumatic, no cyanosis or edema  ECOG PERFORMANCE STATUS: 0 - Asymptomatic  Blood pressure 145/70, pulse 70, temperature 97.8 F (36.6 C), temperature source Oral, resp. rate 20, height 5\' 6"  (1.676 m), weight 153 lb 3.2 oz (69.491 kg), SpO2 98.00%.  LABORATORY DATA: Lab Results  Component Value Date   WBC 33.3* 03/29/2013   HGB 13.3 03/29/2013   HCT 41.0 03/29/2013   MCV 91.6 03/29/2013   PLT 240 03/29/2013      Chemistry      Component Value Date/Time   NA 141 03/29/2013 1442   NA 137 10/15/2011 1501   K 3.9 03/29/2013 1442   K 4.0 10/15/2011  1501   CL 99 09/14/2012 1500   CL 98 10/15/2011 1501   CO2 33* 03/29/2013 1442   CO2 27 10/15/2011 1501   BUN 10.7 03/29/2013 1442   BUN 10 10/15/2011 1501   CREATININE 0.8 03/29/2013 1442   CREATININE 0.77 10/15/2011 1501      Component Value Date/Time   CALCIUM 9.6 03/29/2013 1442   CALCIUM 9.5 10/15/2011 1501   ALKPHOS 66 03/29/2013 1442   ALKPHOS 72 10/15/2011 1501   AST 22 03/29/2013 1442   AST 25 10/15/2011 1501   ALT 14 03/29/2013 1442   ALT 10 10/15/2011 1501   BILITOT 0.52 03/29/2013 1442   BILITOT 0.4 10/15/2011 1501       RADIOGRAPHIC STUDIES: No results found.  ASSESSMENT AND PLAN: This is a very pleasant 79 years old white female with history of chronic lymphocytic  leukemia on observation with no evidence for disease progression. I discussed the lab result with the patient today and recommended for her to continue on observation. I would see her back for followup visit in 6 months with repeat CBC, comprehensive metabolic panel and LDH. She was advised to call immediately if she has any concerning symptoms in the interval.  All questions were answered. The patient knows to call the clinic with any problems, questions or concerns. We can certainly see the patient much sooner if necessary.

## 2013-03-29 NOTE — Patient Instructions (Signed)
Followup visit in 6 months with repeat CBC, comprehensive metabolic panel and LDH. 

## 2013-08-19 ENCOUNTER — Telehealth: Payer: Self-pay | Admitting: Internal Medicine

## 2013-08-19 NOTE — Telephone Encounter (Signed)
Pt called and r/s lab and MD to july due to daughter's schedule

## 2013-09-14 ENCOUNTER — Telehealth: Payer: Self-pay | Admitting: Internal Medicine

## 2013-09-14 NOTE — Telephone Encounter (Signed)
pt daughter called to r/s appt...done...aware of new d.t

## 2013-10-04 ENCOUNTER — Ambulatory Visit: Payer: Medicare Other | Admitting: Internal Medicine

## 2013-10-04 ENCOUNTER — Other Ambulatory Visit: Payer: Medicare Other

## 2013-10-11 ENCOUNTER — Ambulatory Visit: Payer: Medicare Other | Admitting: Internal Medicine

## 2013-10-11 ENCOUNTER — Other Ambulatory Visit: Payer: Medicare Other

## 2013-11-01 ENCOUNTER — Ambulatory Visit (HOSPITAL_BASED_OUTPATIENT_CLINIC_OR_DEPARTMENT_OTHER): Payer: Medicare Other | Admitting: Internal Medicine

## 2013-11-01 ENCOUNTER — Other Ambulatory Visit (HOSPITAL_BASED_OUTPATIENT_CLINIC_OR_DEPARTMENT_OTHER): Payer: Medicare Other

## 2013-11-01 ENCOUNTER — Telehealth: Payer: Self-pay | Admitting: Internal Medicine

## 2013-11-01 VITALS — BP 137/57 | HR 72 | Temp 97.7°F | Resp 19 | Ht 66.0 in | Wt 143.1 lb

## 2013-11-01 DIAGNOSIS — C9110 Chronic lymphocytic leukemia of B-cell type not having achieved remission: Secondary | ICD-10-CM

## 2013-11-01 LAB — COMPREHENSIVE METABOLIC PANEL (CC13)
ALT: 15 U/L (ref 0–55)
AST: 21 U/L (ref 5–34)
Albumin: 4 g/dL (ref 3.5–5.0)
Alkaline Phosphatase: 56 U/L (ref 40–150)
Anion Gap: 9 mEq/L (ref 3–11)
BILIRUBIN TOTAL: 0.42 mg/dL (ref 0.20–1.20)
BUN: 9.8 mg/dL (ref 7.0–26.0)
CO2: 30 mEq/L — ABNORMAL HIGH (ref 22–29)
CREATININE: 0.8 mg/dL (ref 0.6–1.1)
Calcium: 9.2 mg/dL (ref 8.4–10.4)
Chloride: 100 mEq/L (ref 98–109)
GLUCOSE: 97 mg/dL (ref 70–140)
Potassium: 3 mEq/L — CL (ref 3.5–5.1)
Sodium: 139 mEq/L (ref 136–145)
Total Protein: 6.7 g/dL (ref 6.4–8.3)

## 2013-11-01 LAB — CBC WITH DIFFERENTIAL/PLATELET
BASO%: 0.1 % (ref 0.0–2.0)
BASOS ABS: 0 10*3/uL (ref 0.0–0.1)
EOS ABS: 0.1 10*3/uL (ref 0.0–0.5)
EOS%: 0.3 % (ref 0.0–7.0)
HEMATOCRIT: 36 % (ref 34.8–46.6)
HEMOGLOBIN: 11.8 g/dL (ref 11.6–15.9)
LYMPH%: 79.7 % — AB (ref 14.0–49.7)
MCH: 29.8 pg (ref 25.1–34.0)
MCHC: 32.8 g/dL (ref 31.5–36.0)
MCV: 90.9 fL (ref 79.5–101.0)
MONO#: 0.9 10*3/uL (ref 0.1–0.9)
MONO%: 4 % (ref 0.0–14.0)
NEUT%: 15.9 % — ABNORMAL LOW (ref 38.4–76.8)
NEUTROS ABS: 3.8 10*3/uL (ref 1.5–6.5)
PLATELETS: 204 10*3/uL (ref 145–400)
RBC: 3.96 10*6/uL (ref 3.70–5.45)
RDW: 14.3 % (ref 11.2–14.5)
WBC: 23.5 10*3/uL — AB (ref 3.9–10.3)
lymph#: 18.8 10*3/uL — ABNORMAL HIGH (ref 0.9–3.3)

## 2013-11-01 LAB — LACTATE DEHYDROGENASE (CC13): LDH: 144 U/L (ref 125–245)

## 2013-11-01 MED ORDER — POTASSIUM CHLORIDE ER 20 MEQ PO TBCR: 20.0000 meq | EXTENDED_RELEASE_TABLET | Freq: Every day | ORAL | Status: DC

## 2013-11-01 NOTE — Progress Notes (Signed)
[place] [phone]   [phone]  OFFICE PROGRESS NOTE  DIAGNOSIS: Stage 0 chronic lymphocytic leukemia diagnosed in August of 2003   PRIOR THERAPY: None   CURRENT THERAPY: Observation.  INTERVAL HISTORY: Heather Avery 80 y.o. female returns to the clinic today for visit accompanied by her daughter. The patient is feeling fine today with no specific complaints. She is a little bit depressed today because her husband is currently a resident at one of the skilled nursing facility secondary to dementia. She denied having any significant weight loss or night sweats. She has no bleeding issues. She denied having any significant chest pain, shortness of breath, no cough or hemoptysis. The patient has repeat CBC performed earlier today and she is here for evaluation and discussion of her lab results.  MEDICAL HISTORY: Past Medical History  Diagnosis Date  . Hypertension   . COPD (chronic obstructive pulmonary disease)   . Leukemia   . Degeneration of intervertebral disc   . Stroke   . Carotid artery occlusion   . Blind right eye   . Hearing loss in left ear   . Skin cancer     ALLERGIES:  is allergic to hydrocodone.  MEDICATIONS:  Current Outpatient Prescriptions  Medication Sig Dispense Refill  . aspirin 81 MG tablet Take 160 mg by mouth daily.        . clopidogrel (PLAVIX) 75 MG tablet Take 75 mg by mouth daily.        Marland Kitchen levothyroxine (SYNTHROID) 100 MCG tablet Take 112 mcg by mouth daily.       Marland Kitchen LORazepam (ATIVAN) 0.5 MG tablet Take 0.5 mg by mouth 3 (three) times daily as needed. For anxiety       . rosuvastatin (CRESTOR) 20 MG tablet Take 20 mg by mouth at bedtime.        . sertraline (ZOLOFT) 25 MG tablet       . tiotropium (SPIRIVA HANDIHALER) 18 MCG inhalation capsule Place 18 mcg into inhaler and inhale daily.        Marland Kitchen triamterene-hydrochlorothiazide (DYAZIDE) 37.5-25 MG per capsule Take 1 capsule by mouth every morning.          Marland Kitchen HYDROcodone-acetaminophen (NORCO) 5-325 MG per tablet Take 1 tablet by mouth every 6 (six) hours as needed. For pain        No current facility-administered medications for this visit.    REVIEW OF SYSTEMS:  A comprehensive review of systems was negative.   PHYSICAL EXAMINATION: General appearance: alert, cooperative and no distress Head: Normocephalic, without obvious abnormality, atraumatic Neck: no adenopathy Lymph nodes: Cervical, supraclavicular, and axillary nodes normal. Resp: clear to auscultation bilaterally Cardio: regular rate and rhythm, S1, S2 normal, no murmur, click, rub or gallop GI: soft, non-tender; bowel sounds normal; no masses,  no organomegaly Extremities: extremities normal, atraumatic, no cyanosis or edema  ECOG PERFORMANCE STATUS: 0 - Asymptomatic  Blood pressure 137/57, pulse 72, temperature 97.7 F (36.5 C), temperature source Oral, resp. rate 19, height 5\' 6"  (1.676 m), weight 143 lb 1.6 oz (64.91 kg).  LABORATORY DATA: Lab Results  Component Value Date   WBC 23.5* 11/01/2013   HGB 11.8 11/01/2013   HCT 36.0 11/01/2013   MCV 90.9 11/01/2013   PLT 204 11/01/2013      Chemistry      Component Value Date/Time   NA 139 11/01/2013 1448   NA 137 10/15/2011 1501   K 3.0* 11/01/2013 1448  K 4.0 10/15/2011 1501   CL 99 09/14/2012 1500   CL 98 10/15/2011 1501   CO2 30* 11/01/2013 1448   CO2 27 10/15/2011 1501   BUN 9.8 11/01/2013 1448   BUN 10 10/15/2011 1501   CREATININE 0.8 11/01/2013 1448   CREATININE 0.77 10/15/2011 1501      Component Value Date/Time   CALCIUM 9.2 11/01/2013 1448   CALCIUM 9.5 10/15/2011 1501   ALKPHOS 56 11/01/2013 1448   ALKPHOS 72 10/15/2011 1501   AST 21 11/01/2013 1448   AST 25 10/15/2011 1501   ALT 15 11/01/2013 1448   ALT 10 10/15/2011 1501   BILITOT 0.42 11/01/2013 1448   BILITOT 0.4 10/15/2011 1501       RADIOGRAPHIC STUDIES: No results found.  ASSESSMENT AND PLAN: This is a very pleasant 80 years old white female with history of chronic  lymphocytic leukemia on observation with no evidence for disease progression. Her lab work is good today. I discussed the lab result with the patient today and recommended for her to continue on observation. I would see her back for followup visit in 6 months with repeat CBC, comprehensive metabolic panel and LDH. She was advised to call immediately if she has any concerning symptoms in the interval.  All questions were answered. The patient knows to call the clinic with any problems, questions or concerns. We can certainly see the patient much sooner if necessary.  Disclaimer: This note was dictated with voice recognition software. Similar sounding words can inadvertently be transcribed and may not be corrected upon review.

## 2013-11-01 NOTE — Telephone Encounter (Signed)
gv adn rpinted appt sched and avs for pt for Jan 2016

## 2013-11-23 ENCOUNTER — Telehealth: Payer: Self-pay | Admitting: Dietician

## 2013-11-23 NOTE — Telephone Encounter (Signed)
Brief Outpatient Oncology Nutrition Note  Patient has been identified to be at risk on malnutrition screen.  Wt Readings from Last 10 Encounters:  11/01/13 143 lb 1.6 oz (64.91 kg)  03/29/13 153 lb 3.2 oz (69.491 kg)  09/14/12 159 lb 4.8 oz (72.258 kg)  03/16/12 159 lb 4.8 oz (72.258 kg)  10/15/11 161 lb 6.4 oz (73.211 kg)  04/15/11 160 lb 9.6 oz (72.848 kg)     Dx: CLL  Patient of Dr. Julien Nordmann.  Called patient due to weight loss.  Weight loss of 10 lbs (7%) in the past 7 months.  Patient reports that she is doing well.  Liked the weight loss.  Increased stress with husband in nursing facility with dementia.    Discussed importance of caring for herself and do not want to see continued weight loss trend.  Marine RD available as needed.  Antonieta Iba, RD, LDN

## 2014-04-25 DIAGNOSIS — H3411 Central retinal artery occlusion, right eye: Secondary | ICD-10-CM | POA: Diagnosis not present

## 2014-04-25 DIAGNOSIS — H2511 Age-related nuclear cataract, right eye: Secondary | ICD-10-CM | POA: Diagnosis not present

## 2014-04-25 DIAGNOSIS — H524 Presbyopia: Secondary | ICD-10-CM | POA: Diagnosis not present

## 2014-04-25 DIAGNOSIS — H5340 Unspecified visual field defects: Secondary | ICD-10-CM | POA: Diagnosis not present

## 2014-05-02 ENCOUNTER — Other Ambulatory Visit: Payer: Medicare Other

## 2014-05-02 ENCOUNTER — Ambulatory Visit: Payer: Medicare Other | Admitting: Internal Medicine

## 2014-05-02 ENCOUNTER — Telehealth: Payer: Self-pay | Admitting: Internal Medicine

## 2014-05-02 NOTE — Telephone Encounter (Signed)
pt called to r/s appt..done...pt aware of new d.t °

## 2014-05-09 DIAGNOSIS — Z23 Encounter for immunization: Secondary | ICD-10-CM | POA: Diagnosis not present

## 2014-05-09 DIAGNOSIS — E789 Disorder of lipoprotein metabolism, unspecified: Secondary | ICD-10-CM | POA: Diagnosis not present

## 2014-05-09 DIAGNOSIS — E109 Type 1 diabetes mellitus without complications: Secondary | ICD-10-CM | POA: Diagnosis not present

## 2014-05-09 DIAGNOSIS — E039 Hypothyroidism, unspecified: Secondary | ICD-10-CM | POA: Diagnosis not present

## 2014-05-09 DIAGNOSIS — R002 Palpitations: Secondary | ICD-10-CM | POA: Diagnosis not present

## 2014-05-18 ENCOUNTER — Encounter: Payer: Self-pay | Admitting: *Deleted

## 2014-05-18 ENCOUNTER — Other Ambulatory Visit: Payer: Self-pay | Admitting: *Deleted

## 2014-05-18 ENCOUNTER — Encounter (INDEPENDENT_AMBULATORY_CARE_PROVIDER_SITE_OTHER): Payer: 59

## 2014-05-18 DIAGNOSIS — R002 Palpitations: Secondary | ICD-10-CM

## 2014-05-18 NOTE — Progress Notes (Signed)
Patient ID: Heather Avery, female   DOB: [birth year], 81 y.o.   MRN: [id number] [facility] 24 hour holter monitor applied to patient.

## 2014-06-01 ENCOUNTER — Telehealth: Payer: Self-pay | Admitting: Internal Medicine

## 2014-06-01 ENCOUNTER — Other Ambulatory Visit: Payer: Self-pay

## 2014-06-01 ENCOUNTER — Ambulatory Visit: Payer: Self-pay | Admitting: Internal Medicine

## 2014-06-01 NOTE — Telephone Encounter (Signed)
returned call and s.w pt dtr and r.s pt appt to 3.10 to to pt sick...Marland Kitchenok and aware of new d.t

## 2014-06-16 ENCOUNTER — Encounter: Payer: Self-pay | Admitting: Internal Medicine

## 2014-06-16 ENCOUNTER — Other Ambulatory Visit (HOSPITAL_BASED_OUTPATIENT_CLINIC_OR_DEPARTMENT_OTHER): Payer: Medicare Other

## 2014-06-16 ENCOUNTER — Ambulatory Visit (HOSPITAL_BASED_OUTPATIENT_CLINIC_OR_DEPARTMENT_OTHER): Payer: Medicare Other | Admitting: Internal Medicine

## 2014-06-16 ENCOUNTER — Telehealth: Payer: Self-pay | Admitting: Internal Medicine

## 2014-06-16 VITALS — BP 148/67 | HR 68 | Temp 98.5°F | Resp 18 | Ht 65.0 in | Wt 155.1 lb

## 2014-06-16 DIAGNOSIS — C9110 Chronic lymphocytic leukemia of B-cell type not having achieved remission: Secondary | ICD-10-CM | POA: Diagnosis not present

## 2014-06-16 DIAGNOSIS — J449 Chronic obstructive pulmonary disease, unspecified: Secondary | ICD-10-CM | POA: Diagnosis not present

## 2014-06-16 LAB — COMPREHENSIVE METABOLIC PANEL (CC13)
ALT: 18 U/L (ref 0–55)
AST: 23 U/L (ref 5–34)
Albumin: 4 g/dL (ref 3.5–5.0)
Alkaline Phosphatase: 64 U/L (ref 40–150)
Anion Gap: 10 mEq/L (ref 3–11)
BILIRUBIN TOTAL: 0.45 mg/dL (ref 0.20–1.20)
BUN: 15.4 mg/dL (ref 7.0–26.0)
CO2: 31 meq/L — AB (ref 22–29)
Calcium: 9.7 mg/dL (ref 8.4–10.4)
Chloride: 104 mEq/L (ref 98–109)
Creatinine: 0.8 mg/dL (ref 0.6–1.1)
EGFR: 71 mL/min/{1.73_m2} — ABNORMAL LOW (ref >90)
Glucose: 111 mg/dl (ref 70–140)
Potassium: 4.2 mEq/L (ref 3.5–5.1)
Sodium: 145 mEq/L (ref 136–145)
Total Protein: 6.8 g/dL (ref 6.4–8.3)

## 2014-06-16 LAB — CBC WITH DIFFERENTIAL/PLATELET
BASO%: 0.2 % (ref 0.0–2.0)
Basophils Absolute: 0 10*3/uL (ref 0.0–0.1)
EOS%: 0.6 % (ref 0.0–7.0)
Eosinophils Absolute: 0.1 10*3/uL (ref 0.0–0.5)
HCT: 38.8 % (ref 34.8–46.6)
HGB: 12.4 g/dL (ref 11.6–15.9)
LYMPH#: 16.3 10*3/uL — AB (ref 0.9–3.3)
LYMPH%: 81.6 % — ABNORMAL HIGH (ref 14.0–49.7)
MCH: 30 pg (ref 25.1–34.0)
MCHC: 32 g/dL (ref 31.5–36.0)
MCV: 93.7 fL (ref 79.5–101.0)
MONO#: 0.9 10*3/uL (ref 0.1–0.9)
MONO%: 4.4 % (ref 0.0–14.0)
NEUT#: 2.7 10*3/uL (ref 1.5–6.5)
NEUT%: 13.2 % — AB (ref 38.4–76.8)
PLATELETS: 174 10*3/uL (ref 145–400)
RBC: 4.14 10*6/uL (ref 3.70–5.45)
RDW: 14.8 % — ABNORMAL HIGH (ref 11.2–14.5)
WBC: 19.9 10*3/uL — ABNORMAL HIGH (ref 3.9–10.3)

## 2014-06-16 LAB — TECHNOLOGIST REVIEW

## 2014-06-16 LAB — LACTATE DEHYDROGENASE (CC13): LDH: 181 U/L (ref 125–245)

## 2014-06-16 NOTE — Progress Notes (Signed)
[place] [phone]   [phone]) [phone]  OFFICE PROGRESS NOTE  DIAGNOSIS: Stage 0 chronic lymphocytic leukemia diagnosed in August of 2003   PRIOR THERAPY: None   CURRENT THERAPY: Observation.  INTERVAL HISTORY: Heather Avery 81 y.o. female returns to the clinic today for visit accompanied by her daughter. The patient is feeling fine today with no specific complaints. She denied having any significant weight loss or night sweats. She has no bleeding issues. She denied having any significant chest pain, shortness of breath, no cough or hemoptysis. The patient has repeat CBC performed earlier today and she is here for evaluation and discussion of her lab results.  MEDICAL HISTORY: Past Medical History  Diagnosis Date  . Hypertension   . COPD (chronic obstructive pulmonary disease)   . Leukemia   . Degeneration of intervertebral disc   . Stroke   . Carotid artery occlusion   . Blind right eye   . Hearing loss in left ear   . Skin cancer     ALLERGIES:  is allergic to hydrocodone.  MEDICATIONS:  Current Outpatient Prescriptions  Medication Sig Dispense Refill  . aspirin 81 MG tablet Take 160 mg by mouth daily.      . clopidogrel (PLAVIX) 75 MG tablet Take 75 mg by mouth daily.      Marland Kitchen HYDROcodone-acetaminophen (NORCO) 5-325 MG per tablet Take 1 tablet by mouth every 6 (six) hours as needed. For pain     . levothyroxine (SYNTHROID) 100 MCG tablet Take 112 mcg by mouth daily.     Marland Kitchen LORazepam (ATIVAN) 0.5 MG tablet Take 0.5 mg by mouth 3 (three) times daily as needed. For anxiety     . mirtazapine (REMERON) 15 MG tablet Take 15 mg by mouth at bedtime.    . rosuvastatin (CRESTOR) 20 MG tablet Take 20 mg by mouth at bedtime.      Marland Kitchen tiotropium (SPIRIVA HANDIHALER) 18 MCG inhalation capsule Place 18 mcg into inhaler and inhale daily.      Marland Kitchen triamterene-hydrochlorothiazide (DYAZIDE) 37.5-25 MG per capsule Take 1 capsule by mouth every morning.        No current facility-administered medications for this visit.    REVIEW OF SYSTEMS:  A comprehensive review of systems was negative.   PHYSICAL EXAMINATION: General appearance: alert, cooperative and no distress Head: Normocephalic, without obvious abnormality, atraumatic Neck: no adenopathy Lymph nodes: Cervical, supraclavicular, and axillary nodes normal. Resp: clear to auscultation bilaterally Cardio: regular rate and rhythm, S1, S2 normal, no murmur, click, rub or gallop GI: soft, non-tender; bowel sounds normal; no masses,  no organomegaly Extremities: extremities normal, atraumatic, no cyanosis or edema  ECOG PERFORMANCE STATUS: 0 - Asymptomatic  Blood pressure 148/67, pulse 68, temperature 98.5 F (36.9 C), temperature source Oral, resp. rate 18, height 5\' 5"  (1.651 m), weight 155 lb 1.6 oz (70.353 kg), SpO2 100 %.  LABORATORY DATA: Lab Results  Component Value Date   WBC 19.9* 06/16/2014   HGB 12.4 06/16/2014   HCT 38.8 06/16/2014   MCV 93.7 06/16/2014   PLT 174 06/16/2014      Chemistry      Component Value Date/Time   NA 139 11/01/2013 1448   NA 137 10/15/2011 1501   K 3.0* 11/01/2013 1448   K 4.0 10/15/2011 1501   CL 99 09/14/2012 1500   CL 98 10/15/2011 1501   CO2 30* 11/01/2013 1448   CO2 27 10/15/2011 1501   BUN 9.8 11/01/2013  1448   BUN 10 10/15/2011 1501   CREATININE 0.8 11/01/2013 1448   CREATININE 0.77 10/15/2011 1501      Component Value Date/Time   CALCIUM 9.2 11/01/2013 1448   CALCIUM 9.5 10/15/2011 1501   ALKPHOS 56 11/01/2013 1448   ALKPHOS 72 10/15/2011 1501   AST 21 11/01/2013 1448   AST 25 10/15/2011 1501   ALT 15 11/01/2013 1448   ALT 10 10/15/2011 1501   BILITOT 0.42 11/01/2013 1448   BILITOT 0.4 10/15/2011 1501       RADIOGRAPHIC STUDIES: No results found.  ASSESSMENT AND PLAN: This is a very pleasant 81 years old white female with history of chronic lymphocytic leukemia on observation with no evidence for disease  progression. Her CBC today showed decrease in the total white blood count. I discussed the lab result with the patient today and recommended for her to continue on observation. I would see her back for followup visit in one year with repeat CBC, comprehensive metabolic panel and LDH. She was advised to call immediately if she has any concerning symptoms in the interval.  All questions were answered. The patient knows to call the clinic with any problems, questions or concerns. We can certainly see the patient much sooner if necessary.  Disclaimer: This note was dictated with voice recognition software. Similar sounding words can inadvertently be transcribed and may not be corrected upon review.

## 2014-06-16 NOTE — Telephone Encounter (Signed)
gv and rpinted appt sched and avs for pt for March 2017

## 2014-06-30 DIAGNOSIS — R0602 Shortness of breath: Secondary | ICD-10-CM | POA: Diagnosis not present

## 2014-06-30 DIAGNOSIS — E789 Disorder of lipoprotein metabolism, unspecified: Secondary | ICD-10-CM | POA: Diagnosis not present

## 2014-06-30 DIAGNOSIS — F419 Anxiety disorder, unspecified: Secondary | ICD-10-CM | POA: Diagnosis not present

## 2014-07-11 DIAGNOSIS — J449 Chronic obstructive pulmonary disease, unspecified: Secondary | ICD-10-CM | POA: Diagnosis not present

## 2014-07-11 DIAGNOSIS — R05 Cough: Secondary | ICD-10-CM | POA: Diagnosis not present

## 2014-08-15 DIAGNOSIS — H3411 Central retinal artery occlusion, right eye: Secondary | ICD-10-CM | POA: Diagnosis not present

## 2014-10-19 DIAGNOSIS — E789 Disorder of lipoprotein metabolism, unspecified: Secondary | ICD-10-CM | POA: Diagnosis not present

## 2014-10-19 DIAGNOSIS — E0789 Other specified disorders of thyroid: Secondary | ICD-10-CM | POA: Diagnosis not present

## 2014-10-27 DIAGNOSIS — F419 Anxiety disorder, unspecified: Secondary | ICD-10-CM | POA: Diagnosis not present

## 2014-10-27 DIAGNOSIS — F5101 Primary insomnia: Secondary | ICD-10-CM | POA: Diagnosis not present

## 2014-11-09 DIAGNOSIS — C44729 Squamous cell carcinoma of skin of left lower limb, including hip: Secondary | ICD-10-CM | POA: Diagnosis not present

## 2014-12-05 DIAGNOSIS — N390 Urinary tract infection, site not specified: Secondary | ICD-10-CM | POA: Diagnosis not present

## 2014-12-07 DIAGNOSIS — Z85828 Personal history of other malignant neoplasm of skin: Secondary | ICD-10-CM | POA: Diagnosis not present

## 2014-12-07 DIAGNOSIS — Z08 Encounter for follow-up examination after completed treatment for malignant neoplasm: Secondary | ICD-10-CM | POA: Diagnosis not present

## 2014-12-07 DIAGNOSIS — Z1283 Encounter for screening for malignant neoplasm of skin: Secondary | ICD-10-CM | POA: Diagnosis not present

## 2015-01-31 DIAGNOSIS — F419 Anxiety disorder, unspecified: Secondary | ICD-10-CM | POA: Diagnosis not present

## 2015-01-31 DIAGNOSIS — R630 Anorexia: Secondary | ICD-10-CM | POA: Diagnosis not present

## 2015-02-13 DIAGNOSIS — E789 Disorder of lipoprotein metabolism, unspecified: Secondary | ICD-10-CM | POA: Diagnosis not present

## 2015-02-13 DIAGNOSIS — I10 Essential (primary) hypertension: Secondary | ICD-10-CM | POA: Diagnosis not present

## 2015-02-20 DIAGNOSIS — E039 Hypothyroidism, unspecified: Secondary | ICD-10-CM | POA: Diagnosis not present

## 2015-02-20 DIAGNOSIS — R634 Abnormal weight loss: Secondary | ICD-10-CM | POA: Diagnosis not present

## 2015-02-20 DIAGNOSIS — E789 Disorder of lipoprotein metabolism, unspecified: Secondary | ICD-10-CM | POA: Diagnosis not present

## 2015-03-22 DIAGNOSIS — Z79899 Other long term (current) drug therapy: Secondary | ICD-10-CM | POA: Diagnosis not present

## 2015-03-22 DIAGNOSIS — N390 Urinary tract infection, site not specified: Secondary | ICD-10-CM | POA: Diagnosis not present

## 2015-03-22 DIAGNOSIS — I10 Essential (primary) hypertension: Secondary | ICD-10-CM | POA: Diagnosis not present

## 2015-04-24 DIAGNOSIS — D3132 Benign neoplasm of left choroid: Secondary | ICD-10-CM | POA: Diagnosis not present

## 2015-04-24 DIAGNOSIS — H5201 Hypermetropia, right eye: Secondary | ICD-10-CM | POA: Diagnosis not present

## 2015-04-24 DIAGNOSIS — H3411 Central retinal artery occlusion, right eye: Secondary | ICD-10-CM | POA: Diagnosis not present

## 2015-05-03 ENCOUNTER — Telehealth: Payer: Self-pay | Admitting: Internal Medicine

## 2015-05-03 NOTE — Telephone Encounter (Signed)
Left message for patient in regards to appointment change due to provider being out of the office. Schedule sent via mail.

## 2015-05-08 ENCOUNTER — Telehealth: Payer: Self-pay | Admitting: Internal Medicine

## 2015-05-08 NOTE — Telephone Encounter (Signed)
Returned call to [name] to confirm her mothers appointments set for 3/14.

## 2015-05-19 DIAGNOSIS — E039 Hypothyroidism, unspecified: Secondary | ICD-10-CM | POA: Diagnosis not present

## 2015-05-19 DIAGNOSIS — E0789 Other specified disorders of thyroid: Secondary | ICD-10-CM | POA: Diagnosis not present

## 2015-05-19 DIAGNOSIS — R634 Abnormal weight loss: Secondary | ICD-10-CM | POA: Diagnosis not present

## 2015-05-19 DIAGNOSIS — I10 Essential (primary) hypertension: Secondary | ICD-10-CM | POA: Diagnosis not present

## 2015-05-29 DIAGNOSIS — N390 Urinary tract infection, site not specified: Secondary | ICD-10-CM | POA: Diagnosis not present

## 2015-05-29 DIAGNOSIS — E032 Hypothyroidism due to medicaments and other exogenous substances: Secondary | ICD-10-CM | POA: Diagnosis not present

## 2015-06-14 ENCOUNTER — Ambulatory Visit: Payer: Medicare Other | Admitting: Internal Medicine

## 2015-06-14 ENCOUNTER — Other Ambulatory Visit: Payer: Medicare Other

## 2015-06-15 DIAGNOSIS — N390 Urinary tract infection, site not specified: Secondary | ICD-10-CM | POA: Diagnosis not present

## 2015-06-20 ENCOUNTER — Telehealth: Payer: Self-pay | Admitting: Internal Medicine

## 2015-06-20 ENCOUNTER — Ambulatory Visit (HOSPITAL_BASED_OUTPATIENT_CLINIC_OR_DEPARTMENT_OTHER): Payer: Medicare Other | Admitting: Internal Medicine

## 2015-06-20 ENCOUNTER — Encounter: Payer: Self-pay | Admitting: Internal Medicine

## 2015-06-20 ENCOUNTER — Other Ambulatory Visit (HOSPITAL_BASED_OUTPATIENT_CLINIC_OR_DEPARTMENT_OTHER): Payer: Medicare Other

## 2015-06-20 ENCOUNTER — Other Ambulatory Visit: Payer: Self-pay | Admitting: Medical Oncology

## 2015-06-20 VITALS — BP 124/56 | HR 65 | Temp 98.2°F | Resp 17 | Ht 65.0 in | Wt 156.9 lb

## 2015-06-20 DIAGNOSIS — C9110 Chronic lymphocytic leukemia of B-cell type not having achieved remission: Secondary | ICD-10-CM

## 2015-06-20 LAB — LACTATE DEHYDROGENASE: LDH: 159 U/L (ref 125–245)

## 2015-06-20 LAB — COMPREHENSIVE METABOLIC PANEL WITH GFR
ALT: 21 U/L (ref 0–55)
AST: 25 U/L (ref 5–34)
Albumin: 4.3 g/dL (ref 3.5–5.0)
Alkaline Phosphatase: 62 U/L (ref 40–150)
Anion Gap: 10 meq/L (ref 3–11)
BUN: 15.1 mg/dL (ref 7.0–26.0)
CO2: 29 meq/L (ref 22–29)
Calcium: 9.5 mg/dL (ref 8.4–10.4)
Chloride: 100 meq/L (ref 98–109)
Creatinine: 0.9 mg/dL (ref 0.6–1.1)
EGFR: 58 ml/min/1.73 m2 — ABNORMAL LOW
Glucose: 90 mg/dL (ref 70–140)
Potassium: 3.9 meq/L (ref 3.5–5.1)
Sodium: 140 meq/L (ref 136–145)
Total Bilirubin: 0.41 mg/dL (ref 0.20–1.20)
Total Protein: 7.5 g/dL (ref 6.4–8.3)

## 2015-06-20 LAB — CBC WITH DIFFERENTIAL/PLATELET
BASO%: 0.2 % (ref 0.0–2.0)
Basophils Absolute: 0 10*3/uL (ref 0.0–0.1)
EOS%: 0.3 % (ref 0.0–7.0)
Eosinophils Absolute: 0 10*3/uL (ref 0.0–0.5)
HCT: 40.4 % (ref 34.8–46.6)
HEMOGLOBIN: 13 g/dL (ref 11.6–15.9)
LYMPH%: 73.8 % — ABNORMAL HIGH (ref 14.0–49.7)
MCH: 28.9 pg (ref 25.1–34.0)
MCHC: 32.1 g/dL (ref 31.5–36.0)
MCV: 90.1 fL (ref 79.5–101.0)
MONO#: 0.7 10*3/uL (ref 0.1–0.9)
MONO%: 4.2 % (ref 0.0–14.0)
NEUT%: 21.5 % — AB (ref 38.4–76.8)
NEUTROS ABS: 3.6 10*3/uL (ref 1.5–6.5)
PLATELETS: 196 10*3/uL (ref 145–400)
RBC: 4.48 10*6/uL (ref 3.70–5.45)
RDW: 13 % (ref 11.2–14.5)
WBC: 16.7 10*3/uL — ABNORMAL HIGH (ref 3.9–10.3)
lymph#: 12.3 10*3/uL — ABNORMAL HIGH (ref 0.9–3.3)

## 2015-06-20 LAB — TECHNOLOGIST REVIEW

## 2015-06-20 NOTE — Progress Notes (Signed)
[place] [phone]   Fax:(336) [phone]  OFFICE PROGRESS NOTE  DIAGNOSIS: Stage 0 chronic lymphocytic leukemia diagnosed in August of 2003   PRIOR THERAPY: None   CURRENT THERAPY: Observation.  INTERVAL HISTORY: Heather Avery 82 y.o. female returns to the clinic today for annual follow up visit accompanied by her daughter. The patient is feeling fine today with no specific complaints. She has no significant change since her last visit here ago. She denied having any significant weight loss or night sweats. She has no bleeding issues. She denied having any significant chest pain, shortness of breath, no cough or hemoptysis. The patient has repeat CBC performed earlier today and she is here for evaluation and discussion of her lab results.  MEDICAL HISTORY: Past Medical History  Diagnosis Date  . Hypertension   . COPD (chronic obstructive pulmonary disease)   . Leukemia   . Degeneration of intervertebral disc   . Stroke   . Carotid artery occlusion   . Blind right eye   . Hearing loss in left ear   . Skin cancer     ALLERGIES:  is allergic to hydrocodone.  MEDICATIONS:  Current Outpatient Prescriptions  Medication Sig Dispense Refill  . aspirin 81 MG tablet Take 160 mg by mouth daily.      . clopidogrel (PLAVIX) 75 MG tablet Take 75 mg by mouth daily.      Marland Kitchen HYDROcodone-acetaminophen (NORCO) 5-325 MG per tablet Take 1 tablet by mouth every 6 (six) hours as needed. For pain     . LORazepam (ATIVAN) 0.5 MG tablet Take 0.5 mg by mouth 3 (three) times daily as needed. For anxiety     . mirtazapine (REMERON) 15 MG tablet Take 15 mg by mouth at bedtime.    . rosuvastatin (CRESTOR) 20 MG tablet Take 20 mg by mouth at bedtime.      Marland Kitchen SYNTHROID 112 MCG tablet Take 112 mcg by mouth daily.    Marland Kitchen tiotropium (SPIRIVA HANDIHALER) 18 MCG inhalation capsule Place 18 mcg into inhaler and inhale daily.      Marland Kitchen triamterene-hydrochlorothiazide (DYAZIDE) 37.5-25  MG per capsule Take 1 capsule by mouth every morning.       No current facility-administered medications for this visit.    REVIEW OF SYSTEMS:  A comprehensive review of systems was negative.   PHYSICAL EXAMINATION: General appearance: alert, cooperative and no distress Head: Normocephalic, without obvious abnormality, atraumatic Neck: no adenopathy Lymph nodes: Cervical, supraclavicular, and axillary nodes normal. Resp: clear to auscultation bilaterally Cardio: regular rate and rhythm, S1, S2 normal, no murmur, click, rub or gallop GI: soft, non-tender; bowel sounds normal; no masses,  no organomegaly Extremities: extremities normal, atraumatic, no cyanosis or edema  ECOG PERFORMANCE STATUS: 0 - Asymptomatic  Blood pressure 124/56, pulse 65, temperature 98.2 F (36.8 C), temperature source Oral, resp. rate 17, height 5\' 5"  (1.651 m), weight 156 lb 14.4 oz (71.169 kg), SpO2 100 %.  LABORATORY DATA: Lab Results  Component Value Date   WBC 16.7* 06/20/2015   HGB 13.0 06/20/2015   HCT 40.4 06/20/2015   MCV 90.1 06/20/2015   PLT 196 06/20/2015      Chemistry      Component Value Date/Time   NA 140 06/20/2015 1453   NA 137 10/15/2011 1501   K 3.9 06/20/2015 1453   K 4.0 10/15/2011 1501   CL 99 09/14/2012 1500   CL 98 10/15/2011 1501   CO2 29 06/20/2015  1453   CO2 27 10/15/2011 1501   BUN 15.1 06/20/2015 1453   BUN 10 10/15/2011 1501   CREATININE 0.9 06/20/2015 1453   CREATININE 0.77 10/15/2011 1501      Component Value Date/Time   CALCIUM 9.5 06/20/2015 1453   CALCIUM 9.5 10/15/2011 1501   ALKPHOS 62 06/20/2015 1453   ALKPHOS 72 10/15/2011 1501   AST 25 06/20/2015 1453   AST 25 10/15/2011 1501   ALT 21 06/20/2015 1453   ALT 10 10/15/2011 1501   BILITOT 0.41 06/20/2015 1453   BILITOT 0.4 10/15/2011 1501       RADIOGRAPHIC STUDIES: No results found.  ASSESSMENT AND PLAN: This is a very pleasant 82 years old white female with history of chronic lymphocytic  leukemia on observation with no evidence for disease progression. Her CBC today showed further decrease in the total white blood count. I discussed the lab result with the patient today and recommended for her to continue on observation. I would see her back for followup visit in one year with repeat CBC, comprehensive metabolic panel and LDH. She was advised to call immediately if she has any concerning symptoms in the interval.  All questions were answered. The patient knows to call the clinic with any problems, questions or concerns. We can certainly see the patient much sooner if necessary.  Disclaimer: This note was dictated with voice recognition software. Similar sounding words can inadvertently be transcribed and may not be corrected upon review.

## 2015-06-20 NOTE — Telephone Encounter (Signed)
per pof to sch pt appt-gave pt copy of avs °

## 2015-07-24 DIAGNOSIS — Z0000 Encounter for general adult medical examination without abnormal findings: Secondary | ICD-10-CM | POA: Diagnosis not present

## 2015-07-24 DIAGNOSIS — N8111 Cystocele, midline: Secondary | ICD-10-CM | POA: Diagnosis not present

## 2015-07-24 DIAGNOSIS — N3020 Other chronic cystitis without hematuria: Secondary | ICD-10-CM | POA: Diagnosis not present

## 2015-07-24 DIAGNOSIS — N3946 Mixed incontinence: Secondary | ICD-10-CM | POA: Diagnosis not present

## 2016-04-11 DIAGNOSIS — E0789 Other specified disorders of thyroid: Secondary | ICD-10-CM | POA: Diagnosis not present

## 2016-04-11 DIAGNOSIS — N390 Urinary tract infection, site not specified: Secondary | ICD-10-CM | POA: Diagnosis not present

## 2016-04-11 DIAGNOSIS — Z79899 Other long term (current) drug therapy: Secondary | ICD-10-CM | POA: Diagnosis not present

## 2016-04-11 DIAGNOSIS — E039 Hypothyroidism, unspecified: Secondary | ICD-10-CM | POA: Diagnosis not present

## 2016-04-11 DIAGNOSIS — I10 Essential (primary) hypertension: Secondary | ICD-10-CM | POA: Diagnosis not present

## 2016-04-18 DIAGNOSIS — E032 Hypothyroidism due to medicaments and other exogenous substances: Secondary | ICD-10-CM | POA: Diagnosis not present

## 2016-04-18 DIAGNOSIS — E789 Disorder of lipoprotein metabolism, unspecified: Secondary | ICD-10-CM | POA: Diagnosis not present

## 2016-04-18 DIAGNOSIS — C9110 Chronic lymphocytic leukemia of B-cell type not having achieved remission: Secondary | ICD-10-CM | POA: Diagnosis not present

## 2016-05-20 DIAGNOSIS — E789 Disorder of lipoprotein metabolism, unspecified: Secondary | ICD-10-CM | POA: Diagnosis not present

## 2016-05-28 DIAGNOSIS — D3132 Benign neoplasm of left choroid: Secondary | ICD-10-CM | POA: Diagnosis not present

## 2016-05-28 DIAGNOSIS — H524 Presbyopia: Secondary | ICD-10-CM | POA: Diagnosis not present

## 2016-05-28 DIAGNOSIS — H3411 Central retinal artery occlusion, right eye: Secondary | ICD-10-CM | POA: Diagnosis not present

## 2016-06-18 ENCOUNTER — Other Ambulatory Visit: Payer: Medicare Other

## 2016-06-18 ENCOUNTER — Ambulatory Visit: Payer: Medicare Other | Admitting: Internal Medicine

## 2016-07-03 DIAGNOSIS — Z85828 Personal history of other malignant neoplasm of skin: Secondary | ICD-10-CM | POA: Diagnosis not present

## 2016-07-03 DIAGNOSIS — Z08 Encounter for follow-up examination after completed treatment for malignant neoplasm: Secondary | ICD-10-CM | POA: Diagnosis not present

## 2016-07-03 DIAGNOSIS — D225 Melanocytic nevi of trunk: Secondary | ICD-10-CM | POA: Diagnosis not present

## 2016-07-03 DIAGNOSIS — L570 Actinic keratosis: Secondary | ICD-10-CM | POA: Diagnosis not present

## 2016-07-03 DIAGNOSIS — X32XXXD Exposure to sunlight, subsequent encounter: Secondary | ICD-10-CM | POA: Diagnosis not present

## 2016-07-23 ENCOUNTER — Ambulatory Visit: Payer: Medicare Other | Admitting: Internal Medicine

## 2016-07-23 ENCOUNTER — Other Ambulatory Visit: Payer: Medicare Other

## 2016-08-06 ENCOUNTER — Ambulatory Visit (HOSPITAL_BASED_OUTPATIENT_CLINIC_OR_DEPARTMENT_OTHER): Payer: Medicare Other | Admitting: Internal Medicine

## 2016-08-06 ENCOUNTER — Other Ambulatory Visit (HOSPITAL_BASED_OUTPATIENT_CLINIC_OR_DEPARTMENT_OTHER): Payer: Medicare Other

## 2016-08-06 ENCOUNTER — Telehealth: Payer: Self-pay | Admitting: Internal Medicine

## 2016-08-06 VITALS — BP 141/69 | HR 77 | Temp 98.6°F | Ht 65.0 in | Wt 168.9 lb

## 2016-08-06 DIAGNOSIS — C9110 Chronic lymphocytic leukemia of B-cell type not having achieved remission: Secondary | ICD-10-CM

## 2016-08-06 LAB — COMPREHENSIVE METABOLIC PANEL
ALT: 27 U/L (ref 0–55)
AST: 27 U/L (ref 5–34)
Albumin: 4.4 g/dL (ref 3.5–5.0)
Alkaline Phosphatase: 66 U/L (ref 40–150)
Anion Gap: 9 mEq/L (ref 3–11)
BUN: 13 mg/dL (ref 7.0–26.0)
CHLORIDE: 98 meq/L (ref 98–109)
CO2: 31 mEq/L — ABNORMAL HIGH (ref 22–29)
Calcium: 9.5 mg/dL (ref 8.4–10.4)
Creatinine: 0.9 mg/dL (ref 0.6–1.1)
EGFR: 56 mL/min/{1.73_m2} — AB (ref >90)
GLUCOSE: 95 mg/dL (ref 70–140)
POTASSIUM: 3.9 meq/L (ref 3.5–5.1)
SODIUM: 138 meq/L (ref 136–145)
Total Bilirubin: 0.75 mg/dL (ref 0.20–1.20)
Total Protein: 7.4 g/dL (ref 6.4–8.3)

## 2016-08-06 LAB — CBC WITH DIFFERENTIAL/PLATELET
BASO%: 0.2 % (ref 0.0–2.0)
BASOS ABS: 0 10*3/uL (ref 0.0–0.1)
EOS%: 0.2 % (ref 0.0–7.0)
Eosinophils Absolute: 0 10*3/uL (ref 0.0–0.5)
HCT: 41.5 % (ref 34.8–46.6)
HGB: 13.8 g/dL (ref 11.6–15.9)
LYMPH%: 74 % — AB (ref 14.0–49.7)
MCH: 30 pg (ref 25.1–34.0)
MCHC: 33.3 g/dL (ref 31.5–36.0)
MCV: 90.2 fL (ref 79.5–101.0)
MONO#: 0.7 10*3/uL (ref 0.1–0.9)
MONO%: 4.3 % (ref 0.0–14.0)
NEUT#: 3.5 10*3/uL (ref 1.5–6.5)
NEUT%: 21.3 % — AB (ref 38.4–76.8)
Platelets: 189 10*3/uL (ref 145–400)
RBC: 4.6 10*6/uL (ref 3.70–5.45)
RDW: 14.2 % (ref 11.2–14.5)
WBC: 16.6 10*3/uL — ABNORMAL HIGH (ref 3.9–10.3)
lymph#: 12.3 10*3/uL — ABNORMAL HIGH (ref 0.9–3.3)

## 2016-08-06 LAB — LACTATE DEHYDROGENASE: LDH: 175 U/L (ref 125–245)

## 2016-08-06 NOTE — Telephone Encounter (Signed)
Appointments scheduled for 1 year, per 08/06/16 los. Patient was given a copy of the AVS report and appointment schedule per 08/06/16 los.

## 2016-08-06 NOTE — Progress Notes (Signed)
[place] [phone]   Fax:(336) [phone]  OFFICE PROGRESS NOTE  DIAGNOSIS: Stage 0 chronic lymphocytic leukemia diagnosed in August of 2003   PRIOR THERAPY: None   CURRENT THERAPY: Observation.  INTERVAL HISTORY: Heather Avery 83 y.o. female returns to the clinic today for annual follow-up visit accompanied by her daughter. The patient is feeling fine today with no specific complaints. She denied having any significant fatigue or weakness. She gained around 5 pounds since her last visit. She denied having any palpable lymphadenopathy. She denied having any chest pain, shortness of breath, cough or hemoptysis. She has no nausea, vomiting, diarrhea or constipation. She had repeat CBC and comprehensive metabolic panel performed recently and she is here for evaluation and discussion of her lab results.  MEDICAL HISTORY: Past Medical History:  Diagnosis Date  . Blind right eye   . Carotid artery occlusion   . COPD (chronic obstructive pulmonary disease) ([place])   . Degeneration of intervertebral disc   . Hearing loss in left ear   . Hypertension   . Leukemia ([place])   . Skin cancer   . Stroke [facility])     ALLERGIES:  is allergic to hydrocodone.  MEDICATIONS:  Current Outpatient Prescriptions  Medication Sig Dispense Refill  . aspirin 81 MG tablet Take 160 mg by mouth daily.      . clopidogrel (PLAVIX) 75 MG tablet Take 75 mg by mouth daily.      Marland Kitchen HYDROcodone-acetaminophen (NORCO) 5-325 MG per tablet Take 1 tablet by mouth every 6 (six) hours as needed. For pain     . LORazepam (ATIVAN) 0.5 MG tablet Take 0.5 mg by mouth 3 (three) times daily as needed. For anxiety     . mirtazapine (REMERON) 15 MG tablet Take 15 mg by mouth at bedtime.    . rosuvastatin (CRESTOR) 20 MG tablet Take 20 mg by mouth at bedtime.      Marland Kitchen SYNTHROID 112 MCG tablet Take 112 mcg by mouth daily.    Marland Kitchen tiotropium (SPIRIVA HANDIHALER) 18 MCG inhalation capsule Place 18 mcg into  inhaler and inhale daily.      Marland Kitchen triamterene-hydrochlorothiazide (DYAZIDE) 37.5-25 MG per capsule Take 1 capsule by mouth every morning.       No current facility-administered medications for this visit.     REVIEW OF SYSTEMS:  A comprehensive review of systems was negative.   PHYSICAL EXAMINATION: General appearance: alert, cooperative and no distress Head: Normocephalic, without obvious abnormality, atraumatic Neck: no adenopathy Lymph nodes: Cervical, supraclavicular, and axillary nodes normal. Resp: clear to auscultation bilaterally Back: symmetric, no curvature. ROM normal. No CVA tenderness. Cardio: regular rate and rhythm, S1, S2 normal, no murmur, click, rub or gallop GI: soft, non-tender; bowel sounds normal; no masses,  no organomegaly Extremities: extremities normal, atraumatic, no cyanosis or edema  ECOG PERFORMANCE STATUS: 0 - Asymptomatic  Blood pressure (!) 141/69, pulse 77, temperature 98.6 F (37 C), temperature source Oral, height 5\' 5"  (1.651 m), weight 168 lb 14.4 oz (76.6 kg), SpO2 95 %.  LABORATORY DATA: Lab Results  Component Value Date   WBC 16.6 (H) 08/06/2016   HGB 13.8 08/06/2016   HCT 41.5 08/06/2016   MCV 90.2 08/06/2016   PLT 189 08/06/2016      Chemistry      Component Value Date/Time   NA 138 08/06/2016 1447   K 3.9 08/06/2016 1447   CL 99 09/14/2012 1500   CO2 31 (H) 08/06/2016 1447  BUN 13.0 08/06/2016 1447   CREATININE 0.9 08/06/2016 1447      Component Value Date/Time   CALCIUM 9.5 08/06/2016 1447   ALKPHOS 66 08/06/2016 1447   AST 27 08/06/2016 1447   ALT 27 08/06/2016 1447   BILITOT 0.75 08/06/2016 1447       RADIOGRAPHIC STUDIES: No results found.  ASSESSMENT AND PLAN:  This is a very pleasant 83 years old white female with chronic lymphocytic leukemia diagnosed in August 2003 and has been observation since that time. She had repeat CBCs that showed no significant worsening of her disease. Her total white blood count is  stable at 16.6. I discussed the lab result with the patient and her daughter. I recommended for the patient to continue on observation with repeat CBC and comprehensive metabolic panel in one year. She was advised to call immediately if she has any concerning symptoms in the interval. All questions were answered. The patient knows to call the clinic with any problems, questions or concerns. We can certainly see the patient much sooner if necessary. I spent 10 minutes counseling the patient face to face. The total time spent in the appointment was 15 minutes.  Disclaimer: This note was dictated with voice recognition software. Similar sounding words can inadvertently be transcribed and may not be corrected upon review.

## 2016-12-04 DIAGNOSIS — Z85828 Personal history of other malignant neoplasm of skin: Secondary | ICD-10-CM | POA: Diagnosis not present

## 2016-12-04 DIAGNOSIS — X32XXXD Exposure to sunlight, subsequent encounter: Secondary | ICD-10-CM | POA: Diagnosis not present

## 2016-12-04 DIAGNOSIS — D0461 Carcinoma in situ of skin of right upper limb, including shoulder: Secondary | ICD-10-CM | POA: Diagnosis not present

## 2016-12-04 DIAGNOSIS — Z08 Encounter for follow-up examination after completed treatment for malignant neoplasm: Secondary | ICD-10-CM | POA: Diagnosis not present

## 2016-12-04 DIAGNOSIS — L570 Actinic keratosis: Secondary | ICD-10-CM | POA: Diagnosis not present

## 2017-03-31 ENCOUNTER — Telehealth: Payer: Self-pay | Admitting: Internal Medicine

## 2017-03-31 NOTE — Telephone Encounter (Signed)
Due to admin day moved 5/1 lab/fu to am. Schedule mailed.

## 2017-05-08 DIAGNOSIS — R739 Hyperglycemia, unspecified: Secondary | ICD-10-CM | POA: Diagnosis not present

## 2017-05-08 DIAGNOSIS — Z79899 Other long term (current) drug therapy: Secondary | ICD-10-CM | POA: Diagnosis not present

## 2017-05-08 DIAGNOSIS — E039 Hypothyroidism, unspecified: Secondary | ICD-10-CM | POA: Diagnosis not present

## 2017-05-08 DIAGNOSIS — E7800 Pure hypercholesterolemia, unspecified: Secondary | ICD-10-CM | POA: Diagnosis not present

## 2017-05-08 DIAGNOSIS — I10 Essential (primary) hypertension: Secondary | ICD-10-CM | POA: Diagnosis not present

## 2017-06-02 DIAGNOSIS — H3411 Central retinal artery occlusion, right eye: Secondary | ICD-10-CM | POA: Diagnosis not present

## 2017-06-02 DIAGNOSIS — D3132 Benign neoplasm of left choroid: Secondary | ICD-10-CM | POA: Diagnosis not present

## 2017-06-04 DIAGNOSIS — X32XXXD Exposure to sunlight, subsequent encounter: Secondary | ICD-10-CM | POA: Diagnosis not present

## 2017-06-04 DIAGNOSIS — Z08 Encounter for follow-up examination after completed treatment for malignant neoplasm: Secondary | ICD-10-CM | POA: Diagnosis not present

## 2017-06-04 DIAGNOSIS — L570 Actinic keratosis: Secondary | ICD-10-CM | POA: Diagnosis not present

## 2017-06-04 DIAGNOSIS — Z85828 Personal history of other malignant neoplasm of skin: Secondary | ICD-10-CM | POA: Diagnosis not present

## 2017-06-04 DIAGNOSIS — D0462 Carcinoma in situ of skin of left upper limb, including shoulder: Secondary | ICD-10-CM | POA: Diagnosis not present

## 2017-06-09 DIAGNOSIS — N390 Urinary tract infection, site not specified: Secondary | ICD-10-CM | POA: Diagnosis not present

## 2017-06-09 DIAGNOSIS — R739 Hyperglycemia, unspecified: Secondary | ICD-10-CM | POA: Diagnosis not present

## 2017-06-09 DIAGNOSIS — M810 Age-related osteoporosis without current pathological fracture: Secondary | ICD-10-CM | POA: Diagnosis not present

## 2017-06-09 DIAGNOSIS — I10 Essential (primary) hypertension: Secondary | ICD-10-CM | POA: Diagnosis not present

## 2017-06-09 DIAGNOSIS — E039 Hypothyroidism, unspecified: Secondary | ICD-10-CM | POA: Diagnosis not present

## 2017-06-09 DIAGNOSIS — J449 Chronic obstructive pulmonary disease, unspecified: Secondary | ICD-10-CM | POA: Diagnosis not present

## 2017-06-09 DIAGNOSIS — Z780 Asymptomatic menopausal state: Secondary | ICD-10-CM | POA: Diagnosis not present

## 2017-06-18 ENCOUNTER — Other Ambulatory Visit: Payer: Medicare Other | Admitting: Internal Medicine

## 2017-06-18 ENCOUNTER — Other Ambulatory Visit: Payer: Medicare Other

## 2017-06-19 DIAGNOSIS — Z0000 Encounter for general adult medical examination without abnormal findings: Secondary | ICD-10-CM | POA: Diagnosis not present

## 2017-06-19 DIAGNOSIS — J449 Chronic obstructive pulmonary disease, unspecified: Secondary | ICD-10-CM | POA: Diagnosis not present

## 2017-06-19 DIAGNOSIS — I10 Essential (primary) hypertension: Secondary | ICD-10-CM | POA: Diagnosis not present

## 2017-06-19 DIAGNOSIS — E039 Hypothyroidism, unspecified: Secondary | ICD-10-CM | POA: Diagnosis not present

## 2017-06-19 DIAGNOSIS — I639 Cerebral infarction, unspecified: Secondary | ICD-10-CM | POA: Diagnosis not present

## 2017-08-05 ENCOUNTER — Encounter: Payer: Self-pay | Admitting: Internal Medicine

## 2017-08-05 ENCOUNTER — Inpatient Hospital Stay: Payer: Medicare Other | Attending: Internal Medicine

## 2017-08-05 ENCOUNTER — Inpatient Hospital Stay (HOSPITAL_BASED_OUTPATIENT_CLINIC_OR_DEPARTMENT_OTHER): Payer: Medicare Other | Admitting: Internal Medicine

## 2017-08-05 ENCOUNTER — Telehealth: Payer: Self-pay | Admitting: Internal Medicine

## 2017-08-05 VITALS — BP 156/72 | HR 77 | Temp 98.5°F | Resp 18 | Ht 65.0 in | Wt 169.4 lb

## 2017-08-05 DIAGNOSIS — Z7982 Long term (current) use of aspirin: Secondary | ICD-10-CM

## 2017-08-05 DIAGNOSIS — I10 Essential (primary) hypertension: Secondary | ICD-10-CM | POA: Diagnosis not present

## 2017-08-05 DIAGNOSIS — Z85828 Personal history of other malignant neoplasm of skin: Secondary | ICD-10-CM | POA: Insufficient documentation

## 2017-08-05 DIAGNOSIS — I6529 Occlusion and stenosis of unspecified carotid artery: Secondary | ICD-10-CM

## 2017-08-05 DIAGNOSIS — J449 Chronic obstructive pulmonary disease, unspecified: Secondary | ICD-10-CM | POA: Insufficient documentation

## 2017-08-05 DIAGNOSIS — C9190 Lymphoid leukemia, unspecified not having achieved remission: Secondary | ICD-10-CM | POA: Diagnosis not present

## 2017-08-05 DIAGNOSIS — Z79899 Other long term (current) drug therapy: Secondary | ICD-10-CM

## 2017-08-05 DIAGNOSIS — Z8673 Personal history of transient ischemic attack (TIA), and cerebral infarction without residual deficits: Secondary | ICD-10-CM

## 2017-08-05 DIAGNOSIS — C9110 Chronic lymphocytic leukemia of B-cell type not having achieved remission: Secondary | ICD-10-CM

## 2017-08-05 LAB — COMPREHENSIVE METABOLIC PANEL
ALT: 22 U/L (ref 0–55)
ANION GAP: 9 (ref 3–11)
AST: 27 U/L (ref 5–34)
Albumin: 4.5 g/dL (ref 3.5–5.0)
Alkaline Phosphatase: 65 U/L (ref 40–150)
BUN: 11 mg/dL (ref 7–26)
CO2: 29 mmol/L (ref 22–29)
Calcium: 9.5 mg/dL (ref 8.4–10.4)
Chloride: 100 mmol/L (ref 98–109)
Creatinine, Ser: 0.91 mg/dL (ref 0.60–1.10)
GFR calc Af Amer: >60 mL/min (ref >60)
GFR, EST NON AFRICAN AMERICAN: 56 mL/min — AB (ref >60)
Glucose, Bld: 98 mg/dL (ref 70–140)
POTASSIUM: 3.1 mmol/L — AB (ref 3.5–5.1)
Sodium: 138 mmol/L (ref 136–145)
TOTAL PROTEIN: 7.4 g/dL (ref 6.4–8.3)
Total Bilirubin: 0.7 mg/dL (ref 0.2–1.2)

## 2017-08-05 LAB — CBC WITH DIFFERENTIAL/PLATELET
Basophils Absolute: 0 10*3/uL (ref 0.0–0.1)
Basophils Relative: 0 %
Eosinophils Absolute: 0 10*3/uL (ref 0.0–0.5)
Eosinophils Relative: 0 %
HCT: 41 % (ref 34.8–46.6)
Hemoglobin: 13.8 g/dL (ref 11.6–15.9)
LYMPHS PCT: 67 %
Lymphs Abs: 9.5 10*3/uL — ABNORMAL HIGH (ref 0.9–3.3)
MCH: 30.2 pg (ref 25.1–34.0)
MCHC: 33.7 g/dL (ref 31.5–36.0)
MCV: 89.7 fL (ref 79.5–101.0)
MONO ABS: 0.6 10*3/uL (ref 0.1–0.9)
MONOS PCT: 4 %
NEUTROS ABS: 4.2 10*3/uL (ref 1.5–6.5)
Neutrophils Relative %: 29 %
Platelets: 176 10*3/uL (ref 145–400)
RBC: 4.57 MIL/uL (ref 3.70–5.45)
RDW: 14.1 % (ref 11.2–14.5)
WBC: 14.3 10*3/uL — ABNORMAL HIGH (ref 3.9–10.3)

## 2017-08-05 LAB — LACTATE DEHYDROGENASE: LDH: 172 U/L (ref 125–245)

## 2017-08-05 NOTE — Progress Notes (Signed)
[place] [phone]   [phone]  OFFICE PROGRESS NOTE  DIAGNOSIS: Stage 0 chronic lymphocytic leukemia diagnosed in August of 2003   PRIOR THERAPY: None   CURRENT THERAPY: Observation.  INTERVAL HISTORY: Heather Avery 84 y.o. female returns to the clinic today for annual follow-up visit accompanied by her daughter.  The patient is feeling fine today with no specific complaints.  She denied having any weight loss or night sweats.  She has no chest pain, shortness of breath, cough or hemoptysis.  She denied having any fever or chills.  She has no bleeding issues.  She has been in observation since her diagnosis in 2003 with no concerning complaints.  She is here today for evaluation and repeat blood work.   MEDICAL HISTORY: Past Medical History:  Diagnosis Date  . Blind right eye   . Carotid artery occlusion   . COPD (chronic obstructive pulmonary disease) ([place])   . Degeneration of intervertebral disc   . Hearing loss in left ear   . Hypertension   . Leukemia ([place])   . Skin cancer   . Stroke [facility])     ALLERGIES:  is allergic to hydrocodone.  MEDICATIONS:  Current Outpatient Medications  Medication Sig Dispense Refill  . aspirin 81 MG tablet Take 160 mg by mouth daily.      . busPIRone (BUSPAR) 15 MG tablet   2  . clopidogrel (PLAVIX) 75 MG tablet Take 75 mg by mouth daily.      Marland Kitchen HYDROcodone-acetaminophen (NORCO) 5-325 MG per tablet Take 1 tablet by mouth every 6 (six) hours as needed. For pain     . LORazepam (ATIVAN) 0.5 MG tablet Take 0.5 mg by mouth 3 (three) times daily as needed. For anxiety     . mirtazapine (REMERON) 15 MG tablet Take 15 mg by mouth at bedtime.    . nitrofurantoin, macrocrystal-monohydrate, (MACROBID) 100 MG capsule     . PROAIR HFA 108 (90 Base) MCG/ACT inhaler     . rosuvastatin (CRESTOR) 20 MG tablet Take 20 mg by mouth at bedtime.      Marland Kitchen SYNTHROID 112 MCG tablet Take 112 mcg by mouth daily.    Marland Kitchen  tiotropium (SPIRIVA HANDIHALER) 18 MCG inhalation capsule Place 18 mcg into inhaler and inhale daily.      Marland Kitchen triamterene-hydrochlorothiazide (DYAZIDE) 37.5-25 MG per capsule Take 1 capsule by mouth every morning.       No current facility-administered medications for this visit.     REVIEW OF SYSTEMS:  A comprehensive review of systems was negative.   PHYSICAL EXAMINATION: General appearance: alert, cooperative and no distress Head: Normocephalic, without obvious abnormality, atraumatic Neck: no adenopathy Lymph nodes: Cervical, supraclavicular, and axillary nodes normal. Resp: clear to auscultation bilaterally Back: symmetric, no curvature. ROM normal. No CVA tenderness. Cardio: regular rate and rhythm, S1, S2 normal, no murmur, click, rub or gallop GI: soft, non-tender; bowel sounds normal; no masses,  no organomegaly Extremities: extremities normal, atraumatic, no cyanosis or edema  ECOG PERFORMANCE STATUS: 0 - Asymptomatic  Blood pressure (!) 156/72, pulse 77, temperature 98.5 F (36.9 C), temperature source Oral, resp. rate 18, height 5\' 5"  (1.651 m), weight 169 lb 6.4 oz (76.8 kg), SpO2 95 %.  LABORATORY DATA: Lab Results  Component Value Date   WBC 14.3 (H) 08/05/2017   HGB 13.8 08/05/2017   HCT 41.0 08/05/2017   MCV 89.7 08/05/2017   PLT 176 08/05/2017  Chemistry      Component Value Date/Time   NA 138 08/06/2016 1447   K 3.9 08/06/2016 1447   CL 99 09/14/2012 1500   CO2 31 (H) 08/06/2016 1447   BUN 13.0 08/06/2016 1447   CREATININE 0.9 08/06/2016 1447      Component Value Date/Time   CALCIUM 9.5 08/06/2016 1447   ALKPHOS 66 08/06/2016 1447   AST 27 08/06/2016 1447   ALT 27 08/06/2016 1447   BILITOT 0.75 08/06/2016 1447       RADIOGRAPHIC STUDIES: No results found.  ASSESSMENT AND PLAN:  This is a very pleasant 84 years old white female with chronic lymphocytic leukemia diagnosed in August 2003 and has been observation since that time. She had  repeat CBCs that showed no significant worsening of her disease. Her total white blood count is stable at 14.3. The patient has no complaints today.  I recommended for her to continue on observation with repeat CBC, comprehensive metabolic panel and LDH in 1 year. She was advised to call immediately if she has any concerning symptoms in the interval. All questions were answered. The patient knows to call the clinic with any problems, questions or concerns. We can certainly see the patient much sooner if necessary. I spent 10 minutes counseling the patient face to face. The total time spent in the appointment was 15 minutes.  Disclaimer: This note was dictated with voice recognition software. Similar sounding words can inadvertently be transcribed and may not be corrected upon review.

## 2017-08-05 NOTE — Telephone Encounter (Signed)
Appointments scheduled AVS/Calendar printed per 4/30 los °

## 2017-08-06 ENCOUNTER — Ambulatory Visit: Payer: Medicare Other | Admitting: Internal Medicine

## 2017-08-06 ENCOUNTER — Other Ambulatory Visit: Payer: Medicare Other

## 2017-08-25 DIAGNOSIS — C44729 Squamous cell carcinoma of skin of left lower limb, including hip: Secondary | ICD-10-CM | POA: Diagnosis not present

## 2017-08-25 DIAGNOSIS — C44722 Squamous cell carcinoma of skin of right lower limb, including hip: Secondary | ICD-10-CM | POA: Diagnosis not present

## 2017-09-10 DIAGNOSIS — Z08 Encounter for follow-up examination after completed treatment for malignant neoplasm: Secondary | ICD-10-CM | POA: Diagnosis not present

## 2017-09-10 DIAGNOSIS — Z85828 Personal history of other malignant neoplasm of skin: Secondary | ICD-10-CM | POA: Diagnosis not present

## 2017-12-03 DIAGNOSIS — C44722 Squamous cell carcinoma of skin of right lower limb, including hip: Secondary | ICD-10-CM | POA: Diagnosis not present

## 2017-12-18 DIAGNOSIS — E7800 Pure hypercholesterolemia, unspecified: Secondary | ICD-10-CM | POA: Diagnosis not present

## 2017-12-25 DIAGNOSIS — Z23 Encounter for immunization: Secondary | ICD-10-CM | POA: Diagnosis not present

## 2017-12-25 DIAGNOSIS — E7800 Pure hypercholesterolemia, unspecified: Secondary | ICD-10-CM | POA: Diagnosis not present

## 2017-12-25 DIAGNOSIS — E039 Hypothyroidism, unspecified: Secondary | ICD-10-CM | POA: Diagnosis not present

## 2017-12-25 DIAGNOSIS — J449 Chronic obstructive pulmonary disease, unspecified: Secondary | ICD-10-CM | POA: Diagnosis not present

## 2017-12-25 DIAGNOSIS — I10 Essential (primary) hypertension: Secondary | ICD-10-CM | POA: Diagnosis not present

## 2017-12-31 DIAGNOSIS — Z85828 Personal history of other malignant neoplasm of skin: Secondary | ICD-10-CM | POA: Diagnosis not present

## 2017-12-31 DIAGNOSIS — Z08 Encounter for follow-up examination after completed treatment for malignant neoplasm: Secondary | ICD-10-CM | POA: Diagnosis not present

## 2018-01-21 ENCOUNTER — Encounter: Payer: Self-pay | Admitting: Emergency Medicine

## 2018-01-28 ENCOUNTER — Ambulatory Visit (INDEPENDENT_AMBULATORY_CARE_PROVIDER_SITE_OTHER): Payer: Medicare Other | Admitting: Psychiatry

## 2018-01-28 DIAGNOSIS — F329 Major depressive disorder, single episode, unspecified: Secondary | ICD-10-CM | POA: Diagnosis not present

## 2018-01-28 DIAGNOSIS — C9110 Chronic lymphocytic leukemia of B-cell type not having achieved remission: Secondary | ICD-10-CM | POA: Diagnosis not present

## 2018-01-28 DIAGNOSIS — F32A Depression, unspecified: Secondary | ICD-10-CM

## 2018-01-28 MED ORDER — LORAZEPAM 0.5 MG PO TABS: 0.5000 mg | ORAL_TABLET | Freq: Every day | ORAL | 1 refills | Status: DC

## 2018-01-28 MED ORDER — BUSPIRONE HCL 15 MG PO TABS: 15.0000 mg | ORAL_TABLET | Freq: Every day | ORAL | 1 refills | Status: DC

## 2018-01-28 MED ORDER — MIRTAZAPINE 15 MG PO TABS: 15.0000 mg | ORAL_TABLET | Freq: Every day | ORAL | 1 refills | Status: DC

## 2018-01-28 NOTE — Progress Notes (Signed)
[facility]  Patient ID: Heather Avery,  MRN: [id number]  PCP: Jani Gravel, MD  Date of Evaluation: 01/28/2018 Time spent:20 minutes   HISTORY/CURRENT STATUS: HPI patient is an 84 year old white female last seen 10/08/2017.  She is always accompanied by her daughter [name].  At her last visit she complained of sleep eating at 2 AM.  She denied depression or anxiety.  Does feel like it took less to get her agitated.  The memory daughter feels it is age-related. Today she feels about the same.  No real depression.  Problems with her memory that daughter feels are more than age related.  Individual Medical History/ Review of Systems: Changes? :No  Allergies: Codeine and Hydrocodone  Current Medications:  Current Outpatient Medications:  .  aspirin 81 MG tablet, Take 160 mg by mouth daily.  , Disp: , Rfl:  .  busPIRone (BUSPAR) 15 MG tablet, Take 1 tablet (15 mg total) by mouth daily., Disp: 30 tablet, Rfl: 1 .  clopidogrel (PLAVIX) 75 MG tablet, Take 75 mg by mouth daily.  , Disp: , Rfl:  .  LORazepam (ATIVAN) 0.5 MG tablet, Take 1 tablet (0.5 mg total) by mouth daily. For anxiety, Disp: 30 tablet, Rfl: 1 .  mirtazapine (REMERON) 15 MG tablet, Take 1 tablet (15 mg total) by mouth at bedtime., Disp: 30 tablet, Rfl: 1 .  PROAIR HFA 108 (90 Base) MCG/ACT inhaler, , Disp: , Rfl:  .  rosuvastatin (CRESTOR) 20 MG tablet, Take 20 mg by mouth at bedtime.  , Disp: , Rfl:  .  SYNTHROID 112 MCG tablet, Take 112 mcg by mouth daily., Disp: , Rfl:  .  tiotropium (SPIRIVA HANDIHALER) 18 MCG inhalation capsule, Place 18 mcg into inhaler and inhale daily.  , Disp: , Rfl:  .  triamterene-hydrochlorothiazide (DYAZIDE) 37.5-25 MG per capsule, Take 1 capsule by mouth every morning.  , Disp: , Rfl:  .  divalproex (DEPAKOTE ER) 250 MG 24 hr tablet, Take 250 mg by mouth daily., Disp: , Rfl:  .  HYDROcodone-acetaminophen (NORCO) 5-325 MG per tablet, Take 1 tablet by mouth every 6 (six) hours as  needed. For pain , Disp: , Rfl:  .  nitrofurantoin, macrocrystal-monohydrate, (MACROBID) 100 MG capsule, , Disp: , Rfl:  Medication Side Effects: None  Family Medical/ Social History: Changes? No  MENTAL HEALTH EXAM:  There were no vitals taken for this visit.There is no height or weight on file to calculate BMI.  General Appearance: Casual  Eye Contact:  Good  Speech:  Clear and Coherent  Volume:  Normal  Mood:  Depressed  Affect:  Appropriate  Thought Process:  Linear  Orientation:  Full (Time, Place, and Person)  Thought Content: Logical   Suicidal Thoughts:  No  Homicidal Thoughts:  No  Memory:  MoCA was done. Without glasses so unable to do 8 of the top points. She scored 13 out of 22.  Judgement:  Good  Insight:  Good  Psychomotor Activity:  Normal  Concentration:  Concentration: Good  Recall:  [place] of Knowledge: Good  Language: Good  Akathisia:  NA  AIMS (if indicated): na  Assets:  Desire for Improvement  ADL's:  Intact  Cognition: WNL  Prognosis:  Good    DIAGNOSES:    ICD-10-CM   1. Depression, unspecified depression type F32.9   2. CLL (chronic lymphocytic leukemia) (HCC) C91.10 mirtazapine (REMERON) 15 MG tablet    RECOMMENDATIONS: We will keep her medications the same at present to  include BuSpar 15 mg at bedtime, Remeron 15 mg at bedtime, Ativan 0.5 mg every morning.  She is to return in 1 month and we will repeat the [facility] Cognitive Assessment.    Comer Locket, PA-C

## 2018-03-02 ENCOUNTER — Ambulatory Visit: Payer: Medicare Other | Admitting: Psychiatry

## 2018-03-02 DIAGNOSIS — F329 Major depressive disorder, single episode, unspecified: Secondary | ICD-10-CM | POA: Diagnosis not present

## 2018-03-02 DIAGNOSIS — F32A Depression, unspecified: Secondary | ICD-10-CM

## 2018-03-02 DIAGNOSIS — R413 Other amnesia: Secondary | ICD-10-CM | POA: Diagnosis not present

## 2018-03-02 MED ORDER — DONEPEZIL HCL 5 MG PO TABS: 5.0000 mg | ORAL_TABLET | Freq: Every day | ORAL | 1 refills | Status: DC

## 2018-03-02 NOTE — Progress Notes (Signed)
[facility]  Patient ID: Heather Avery,  MRN: [id number]  PCP: Jani Gravel, MD  Date of Evaluation: 03/02/2018 Time spent:20 minutes  Chief Complaint:   HISTORY/CURRENT STATUS: HPI patient seen 1 month ago.  No real depression. Daughter's concern was pt's memory. A montreal cognitive assessment was done. Pt didn't wear her glasses so unable to do full test. She scored 13-18/22.  Currently no depression. Anxious when has to get ready to go somewhere.  Memory-worse since last visit. No sense of time. More short turn problems with memory. No recent head injury.  Individual Medical History/ Review of Systems: Changes? :No   Allergies: Codeine and Hydrocodone  Current Medications:  Current Outpatient Medications:  .  busPIRone (BUSPAR) 15 MG tablet, Take 1 tablet (15 mg total) by mouth daily., Disp: 30 tablet, Rfl: 1 .  clopidogrel (PLAVIX) 75 MG tablet, Take 75 mg by mouth daily.  , Disp: , Rfl:  .  LORazepam (ATIVAN) 0.5 MG tablet, Take 1 tablet (0.5 mg total) by mouth daily. For anxiety, Disp: 30 tablet, Rfl: 1 .  mirtazapine (REMERON) 15 MG tablet, Take 1 tablet (15 mg total) by mouth at bedtime., Disp: 30 tablet, Rfl: 1 .  rosuvastatin (CRESTOR) 20 MG tablet, Take 20 mg by mouth at bedtime.  , Disp: , Rfl:  .  SYNTHROID 112 MCG tablet, Take 112 mcg by mouth daily., Disp: , Rfl:  .  tiotropium (SPIRIVA HANDIHALER) 18 MCG inhalation capsule, Place 18 mcg into inhaler and inhale daily.  , Disp: , Rfl:  .  triamterene-hydrochlorothiazide (DYAZIDE) 37.5-25 MG per capsule, Take 1 capsule by mouth every morning.  , Disp: , Rfl:  .  aspirin 81 MG tablet, Take 160 mg by mouth daily.  , Disp: , Rfl:  .  divalproex (DEPAKOTE ER) 250 MG 24 hr tablet, Take 250 mg by mouth daily., Disp: , Rfl:  .  donepezil (ARICEPT) 5 MG tablet, Take 1 tablet (5 mg total) by mouth at bedtime., Disp: 30 tablet, Rfl: 1 .  HYDROcodone-acetaminophen (NORCO) 5-325 MG per tablet, Take 1 tablet by mouth  every 6 (six) hours as needed. For pain , Disp: , Rfl:  .  nitrofurantoin, macrocrystal-monohydrate, (MACROBID) 100 MG capsule, , Disp: , Rfl:  .  PROAIR HFA 108 (90 Base) MCG/ACT inhaler, , Disp: , Rfl:  Medication Side Effects: none  Family Medical/ Social History: Changes? No  MENTAL HEALTH EXAM:  There were no vitals taken for this visit.There is no height or weight on file to calculate BMI.  General Appearance: Casual  Eye Contact:  Good  Speech:  Clear and Coherent  Volume:  Normal  Mood:  Euthymic  Affect:  Appropriate  Thought Process:  Linear  Orientation:  Full (Time, Place, and Person)  Thought Content: Logical   Suicidal Thoughts:  No  Homicidal Thoughts:  No  Memory:  Recent;   Fair  Judgement:  Good  Insight:  Good  Psychomotor Activity:  Normal  Concentration:  Concentration: Good  Recall:  Poor  Fund of Knowledge: Good  Language: Good  Assets:  Desire for Improvement  ADL's:  Intact  Cognition: WNL  Prognosis:  Good    DIAGNOSES:    ICD-10-CM   1. Depression, unspecified depression type F32.9   2. Memory change R41.3     Receiving Psychotherapy: No    RECOMMENDATIONS: We repeated the [facility] cognitive assessment test today scored 19 after out of 30.  Last visit there was instructor error.  Daughter  is encouraged to have patient do games to improve her mind/memory. She recently had blood work at her family physician and we will get her to sign a release for that.  Patient did start on Aricept 5 mg a day.  Will wait to start [place].  She is to return in 1 month.   Comer Locket, PA-C

## 2018-03-26 ENCOUNTER — Ambulatory Visit: Payer: Medicare Other | Admitting: Psychiatry

## 2018-04-03 ENCOUNTER — Other Ambulatory Visit: Payer: Self-pay | Admitting: Psychiatry

## 2018-04-07 ENCOUNTER — Other Ambulatory Visit: Payer: Self-pay | Admitting: Psychiatry

## 2018-04-15 ENCOUNTER — Ambulatory Visit: Payer: Medicare Other | Admitting: Psychiatry

## 2018-04-20 ENCOUNTER — Ambulatory Visit: Payer: Medicare Other | Admitting: Psychiatry

## 2018-04-20 DIAGNOSIS — C9110 Chronic lymphocytic leukemia of B-cell type not having achieved remission: Secondary | ICD-10-CM | POA: Diagnosis not present

## 2018-04-20 DIAGNOSIS — F329 Major depressive disorder, single episode, unspecified: Secondary | ICD-10-CM

## 2018-04-20 DIAGNOSIS — R413 Other amnesia: Secondary | ICD-10-CM | POA: Diagnosis not present

## 2018-04-20 DIAGNOSIS — F32A Depression, unspecified: Secondary | ICD-10-CM

## 2018-04-20 MED ORDER — DONEPEZIL HCL 10 MG PO TABS: 10.0000 mg | ORAL_TABLET | Freq: Every day | ORAL | 1 refills | Status: DC

## 2018-04-20 MED ORDER — MIRTAZAPINE 15 MG PO TABS: 15.0000 mg | ORAL_TABLET | Freq: Every day | ORAL | 1 refills | Status: DC

## 2018-04-20 MED ORDER — LORAZEPAM 0.5 MG PO TABS: 0.5000 mg | ORAL_TABLET | Freq: Every day | ORAL | 1 refills | Status: DC

## 2018-04-20 MED ORDER — BUSPIRONE HCL 15 MG PO TABS: 15.0000 mg | ORAL_TABLET | Freq: Every day | ORAL | 1 refills | Status: DC

## 2018-04-20 NOTE — Progress Notes (Signed)
[facility]  Patient ID: Heather Avery,  MRN: [id number]  PCP: Jani Gravel, MD  Date of Evaluation: 04/20/2018 Time spent:20 minutes  Chief Complaint:   HISTORY/CURRENT STATUS: HPI patient seen 03/02/2018.  At that time her anxiety was about the same.  She had no depression.  Felt her memory was worse.  We started her on Aricept 5 mg a day.   currently patient feels like her memory is about the same.  She has problems with focus.  She has some depression as she states she wishes she was not alive.  No other suicidal thoughts.  Individual Medical History/ Review of Systems: Changes? :No   Allergies: Codeine and Hydrocodone  Current Medications:  Current Outpatient Medications:  .  aspirin 81 MG tablet, Take 160 mg by mouth daily.  , Disp: , Rfl:  .  busPIRone (BUSPAR) 15 MG tablet, Take 1 tablet (15 mg total) by mouth daily., Disp: 30 tablet, Rfl: 1 .  clopidogrel (PLAVIX) 75 MG tablet, Take 75 mg by mouth daily.  , Disp: , Rfl:  .  divalproex (DEPAKOTE ER) 250 MG 24 hr tablet, Take 250 mg by mouth daily., Disp: , Rfl:  .  donepezil (ARICEPT) 5 MG tablet, Take 1 tablet (5 mg total) by mouth at bedtime., Disp: 30 tablet, Rfl: 1 .  HYDROcodone-acetaminophen (NORCO) 5-325 MG per tablet, Take 1 tablet by mouth every 6 (six) hours as needed. For pain , Disp: , Rfl:  .  LORazepam (ATIVAN) 0.5 MG tablet, TAKE 1 TABLET BY MOUTH ONCE DAILY FOR ANXIETY, Disp: 30 tablet, Rfl: 1 .  mirtazapine (REMERON) 15 MG tablet, Take 1 tablet (15 mg total) by mouth at bedtime., Disp: 30 tablet, Rfl: 1 .  nitrofurantoin, macrocrystal-monohydrate, (MACROBID) 100 MG capsule, , Disp: , Rfl:  .  PROAIR HFA 108 (90 Base) MCG/ACT inhaler, , Disp: , Rfl:  .  rosuvastatin (CRESTOR) 20 MG tablet, Take 20 mg by mouth at bedtime.  , Disp: , Rfl:  .  SYNTHROID 112 MCG tablet, Take 112 mcg by mouth daily., Disp: , Rfl:  .  tiotropium (SPIRIVA HANDIHALER) 18 MCG inhalation capsule, Place 18 mcg into inhaler and  inhale daily.  , Disp: , Rfl:  .  triamterene-hydrochlorothiazide (DYAZIDE) 37.5-25 MG per capsule, Take 1 capsule by mouth every morning.  , Disp: , Rfl:  Medication Side Effects: none  Family Medical/ Social History: Changes? no  MENTAL HEALTH EXAM:  There were no vitals taken for this visit.There is no height or weight on file to calculate BMI.  General Appearance: Casual  Eye Contact:  Good  Speech:  Clear and Coherent  Volume:  Normal  Mood:  Depressed  Affect:  Appropriate  Thought Process:  Linear  Orientation:  Full (Time, Place, and Person)  Thought Content: Logical   Suicidal Thoughts:  No  Homicidal Thoughts:  No  Memory-decreased  Judgement:  Good  Insight:  Good  Psychomotor Activity:  Normal  Concentration:  Concentration: Good  Recall:  Good  Fund of Knowledge: Good  Language: Good  Assets:  Desire for Improvement  ADL's:  Intact  Cognition: WNL  Prognosis:  Good    DIAGNOSES: No diagnosis found.  Receiving Psychotherapy: No    RECOMMENDATIONS: Patient is to increase her Aricept to 10 mg a day.  Continue BuSpar 15 mg once a day continue Ativan 0.5 mg once a day continue Remeron 15 mg daily. Patient has routine lab work done next month.  She is to  sign a release of information so we can get these labs.  May need dementia labs done. Return in 2 months.   Comer Locket, PA-C

## 2018-04-21 ENCOUNTER — Telehealth: Payer: Self-pay

## 2018-04-21 NOTE — Telephone Encounter (Signed)
Spoke with daughter [name] and recommend Kamryn start B complex vitamin along with NAC 600mg  BID. Instructed where to get those vitamins.

## 2018-04-21 NOTE — Telephone Encounter (Signed)
-----   Message from Comer Locket, [place] sent at 04/20/2018  6:11 PM EST ----- Please call pt and have her start a B complex vitamin and NAC 600mg  bid. Can get NAC at vitamin shop.

## 2018-06-09 DIAGNOSIS — D3132 Benign neoplasm of left choroid: Secondary | ICD-10-CM | POA: Diagnosis not present

## 2018-06-09 DIAGNOSIS — H5340 Unspecified visual field defects: Secondary | ICD-10-CM | POA: Diagnosis not present

## 2018-06-09 DIAGNOSIS — H524 Presbyopia: Secondary | ICD-10-CM | POA: Diagnosis not present

## 2018-06-09 DIAGNOSIS — H3411 Central retinal artery occlusion, right eye: Secondary | ICD-10-CM | POA: Diagnosis not present

## 2018-06-15 ENCOUNTER — Ambulatory Visit: Payer: Medicare Other | Admitting: Psychiatry

## 2018-06-15 DIAGNOSIS — C9110 Chronic lymphocytic leukemia of B-cell type not having achieved remission: Secondary | ICD-10-CM

## 2018-06-15 DIAGNOSIS — F3341 Major depressive disorder, recurrent, in partial remission: Secondary | ICD-10-CM | POA: Diagnosis not present

## 2018-06-15 MED ORDER — DIVALPROEX SODIUM ER 250 MG PO TB24: 250.0000 mg | ORAL_TABLET | Freq: Every day | ORAL | 1 refills | Status: DC

## 2018-06-15 MED ORDER — BUSPIRONE HCL 15 MG PO TABS: 15.0000 mg | ORAL_TABLET | Freq: Every day | ORAL | 1 refills | Status: DC

## 2018-06-15 MED ORDER — LORAZEPAM 0.5 MG PO TABS: 0.5000 mg | ORAL_TABLET | Freq: Every day | ORAL | 1 refills | Status: DC

## 2018-06-15 MED ORDER — MIRTAZAPINE 15 MG PO TABS: 15.0000 mg | ORAL_TABLET | Freq: Every day | ORAL | 1 refills | Status: DC

## 2018-06-15 NOTE — Progress Notes (Signed)
[facility]  Patient ID: Heather Avery,  MRN: [id number]  PCP: Jani Gravel, MD  Date of Evaluation: 06/15/2018 Time spent:20 minutes  Chief Complaint:   HISTORY/CURRENT STATUS: HPI patient is doing about the same.  Has depression.  Denies suicidal thoughts  Individual Medical History/ Review of Systems: Changes? :No   Allergies: Codeine and Hydrocodone  Current Medications:  Current Outpatient Medications:  .  busPIRone (BUSPAR) 15 MG tablet, Take 1 tablet (15 mg total) by mouth daily., Disp: 30 tablet, Rfl: 1 .  divalproex (DEPAKOTE ER) 250 MG 24 hr tablet, Take 1 tablet (250 mg total) by mouth daily., Disp: 30 tablet, Rfl: 1 .  donepezil (ARICEPT) 10 MG tablet, Take 1 tablet (10 mg total) by mouth at bedtime., Disp: 30 tablet, Rfl: 1 .  LORazepam (ATIVAN) 0.5 MG tablet, Take 1 tablet (0.5 mg total) by mouth at bedtime., Disp: 30 tablet, Rfl: 1 .  mirtazapine (REMERON) 15 MG tablet, Take 1 tablet (15 mg total) by mouth at bedtime., Disp: 30 tablet, Rfl: 1 .  aspirin 81 MG tablet, Take 160 mg by mouth daily.  , Disp: , Rfl:  .  clopidogrel (PLAVIX) 75 MG tablet, Take 75 mg by mouth daily.  , Disp: , Rfl:  .  HYDROcodone-acetaminophen (NORCO) 5-325 MG per tablet, Take 1 tablet by mouth every 6 (six) hours as needed. For pain , Disp: , Rfl:  .  nitrofurantoin, macrocrystal-monohydrate, (MACROBID) 100 MG capsule, , Disp: , Rfl:  .  PROAIR HFA 108 (90 Base) MCG/ACT inhaler, , Disp: , Rfl:  .  rosuvastatin (CRESTOR) 20 MG tablet, Take 20 mg by mouth at bedtime.  , Disp: , Rfl:  .  SYNTHROID 112 MCG tablet, Take 112 mcg by mouth daily., Disp: , Rfl:  .  tiotropium (SPIRIVA HANDIHALER) 18 MCG inhalation capsule, Place 18 mcg into inhaler and inhale daily.  , Disp: , Rfl:  .  triamterene-hydrochlorothiazide (DYAZIDE) 37.5-25 MG per capsule, Take 1 capsule by mouth every morning.  , Disp: , Rfl:  Medication Side Effects: none  Family Medical/ Social History: Changes?  no  MENTAL HEALTH EXAM:  There were no vitals taken for this visit.There is no height or weight on file to calculate BMI.  General Appearance: Casual  Eye Contact:  Good  Speech:  Clear and Coherent  Volume:  Normal  Mood:  Depressed  Affect:  Appropriate  Thought Process:  Linear  Orientation:  Full (Time, Place, and Person)  Thought Content: Logical   Suicidal Thoughts:  No  Homicidal Thoughts:  No  Memory:  WNL  Judgement:  Good  Insight:  Good  Psychomotor Activity:  Normal  Concentration:  Concentration: Good  Recall:  Good  Fund of Knowledge: Good  Language: Good  Assets:  Desire for Improvement  ADL's:  Intact  Cognition: WNL  Prognosis:  Fair    DIAGNOSES:    ICD-10-CM   1. Recurrent major depressive disorder, in partial remission ([place]) F33.41   2. CLL (chronic lymphocytic leukemia) (HCC) C91.10 mirtazapine (REMERON) 15 MG tablet    Receiving Psychotherapy: No    RECOMMENDATIONS: Increase Ms. Mccarey Aricept to 20 mg a day.  Wrote down the labs I want her to get.  After her labs are back we can see if we need to go up any medication.  Return in 2 months.   Comer Locket, PA-C

## 2018-06-17 DIAGNOSIS — E7800 Pure hypercholesterolemia, unspecified: Secondary | ICD-10-CM | POA: Diagnosis not present

## 2018-06-17 DIAGNOSIS — E039 Hypothyroidism, unspecified: Secondary | ICD-10-CM | POA: Diagnosis not present

## 2018-06-17 DIAGNOSIS — I10 Essential (primary) hypertension: Secondary | ICD-10-CM | POA: Diagnosis not present

## 2018-06-17 DIAGNOSIS — Z0000 Encounter for general adult medical examination without abnormal findings: Secondary | ICD-10-CM | POA: Diagnosis not present

## 2018-06-26 ENCOUNTER — Other Ambulatory Visit: Payer: Self-pay | Admitting: Psychiatry

## 2018-07-29 ENCOUNTER — Telehealth: Payer: Self-pay | Admitting: Physician Assistant

## 2018-07-29 NOTE — Telephone Encounter (Signed)
Traci, tomorrow, would you please call and get the info for the home health agency, and if they'll take a verbal order, go ahead and give it to them.  "decrease donepezil (aricept) 10 mg to 1 po qhs"  If they need something in writing, I'll go in the office in a few days and fax an order, just need all the info.   Thanks

## 2018-07-29 NOTE — Telephone Encounter (Signed)
Received a fax today from Phoebe Perch at [facility]. Resident is requesting Donepezil Hcl 10mg , take 2 tabs po @ hs to be changed to Donepezil Hcl 10mg , take 1 tab po @ hs.  Thanks!

## 2018-07-30 NOTE — Telephone Encounter (Signed)
Nurse from [facility] and said a fax with an order with the decrease in donepezil 10 mg 1 at hs. They handle her medication now. Fax number is [phone] No rush whenever it can get faxed.

## 2018-07-30 NOTE — Telephone Encounter (Signed)
Left the nurse a message to call back and let us know how to submit the order to them.

## 2018-08-03 ENCOUNTER — Other Ambulatory Visit: Payer: Medicare Other

## 2018-08-03 ENCOUNTER — Ambulatory Visit: Payer: Medicare Other | Admitting: Internal Medicine

## 2018-08-10 ENCOUNTER — Ambulatory Visit: Payer: Medicare Other | Admitting: Psychiatry

## 2018-09-09 ENCOUNTER — Ambulatory Visit: Payer: Medicare Other | Admitting: Physician Assistant

## 2018-09-21 ENCOUNTER — Other Ambulatory Visit: Payer: Medicare Other

## 2018-09-21 ENCOUNTER — Ambulatory Visit: Payer: Medicare Other | Admitting: Internal Medicine

## 2018-09-28 ENCOUNTER — Other Ambulatory Visit: Payer: Self-pay

## 2018-09-28 DIAGNOSIS — C9110 Chronic lymphocytic leukemia of B-cell type not having achieved remission: Secondary | ICD-10-CM

## 2018-09-28 MED ORDER — MIRTAZAPINE 15 MG PO TABS: 15.0000 mg | ORAL_TABLET | Freq: Every day | ORAL | 1 refills | Status: DC

## 2018-09-29 ENCOUNTER — Other Ambulatory Visit: Payer: Self-pay

## 2018-09-29 ENCOUNTER — Telehealth: Payer: Self-pay | Admitting: Physician Assistant

## 2018-09-29 MED ORDER — LORAZEPAM 0.5 MG PO TABS: 0.5000 mg | ORAL_TABLET | Freq: Every day | ORAL | 1 refills | Status: DC

## 2018-09-29 MED ORDER — BUSPIRONE HCL 15 MG PO TABS: 15.0000 mg | ORAL_TABLET | Freq: Every day | ORAL | 1 refills | Status: DC

## 2018-09-29 NOTE — Telephone Encounter (Signed)
Pt needs refill on Buspar & Lorazepam. Remeron was called in yesterday. Per Nanetta Batty. [facility]  [address]

## 2018-09-29 NOTE — Telephone Encounter (Signed)
Completed already, duplicate messages

## 2018-09-29 NOTE — Telephone Encounter (Signed)
Refills submitted, pt needs to be set up with new provider as well.

## 2018-09-29 NOTE — Telephone Encounter (Signed)
Pt need refills on Buspar and Lorezepam. Remeron called in yesterday. [facility]  [address]

## 2018-10-19 ENCOUNTER — Telehealth: Payer: Self-pay | Admitting: Physician Assistant

## 2018-10-19 ENCOUNTER — Other Ambulatory Visit: Payer: Self-pay | Admitting: Physician Assistant

## 2018-10-19 MED ORDER — DONEPEZIL HCL 10 MG PO TABS: 20.0000 mg | ORAL_TABLET | Freq: Every day | ORAL | 5 refills | Status: DC

## 2018-10-19 NOTE — Telephone Encounter (Signed)
Daughter, [name], called to request refill of Loralyn's aricept.  Please send to [facility] on [address].  No appt is set because she is in an assistant living facility, [facility], and she is in quarantine there and can't come to appts.  [name] will talk with the nurse there to see if they can line up a telephone visit.

## 2018-10-19 NOTE — Telephone Encounter (Signed)
I sent it in.  If they can't easily do an appt, that's okay.  As long as she's stable, I won't change anything. Hopefully she can be seen or have phone consult if insurance pays sometime in the next 4-5 months.

## 2018-11-04 ENCOUNTER — Ambulatory Visit: Payer: Medicare Other | Admitting: Physician Assistant

## 2018-11-27 ENCOUNTER — Other Ambulatory Visit: Payer: Self-pay | Admitting: Physician Assistant

## 2018-11-27 ENCOUNTER — Other Ambulatory Visit: Payer: Self-pay | Admitting: Psychiatry

## 2018-11-27 DIAGNOSIS — C9110 Chronic lymphocytic leukemia of B-cell type not having achieved remission: Secondary | ICD-10-CM

## 2018-12-22 ENCOUNTER — Ambulatory Visit (INDEPENDENT_AMBULATORY_CARE_PROVIDER_SITE_OTHER): Payer: Medicare Other | Admitting: Physician Assistant

## 2018-12-22 ENCOUNTER — Other Ambulatory Visit: Payer: Self-pay

## 2018-12-22 ENCOUNTER — Encounter: Payer: Self-pay | Admitting: Physician Assistant

## 2018-12-22 VITALS — BP 110/68 | HR 68 | Temp 98.1°F | Wt 145.0 lb

## 2018-12-22 DIAGNOSIS — R413 Other amnesia: Secondary | ICD-10-CM

## 2018-12-22 DIAGNOSIS — C9110 Chronic lymphocytic leukemia of B-cell type not having achieved remission: Secondary | ICD-10-CM | POA: Diagnosis not present

## 2018-12-22 DIAGNOSIS — F3341 Major depressive disorder, recurrent, in partial remission: Secondary | ICD-10-CM

## 2018-12-22 DIAGNOSIS — F411 Generalized anxiety disorder: Secondary | ICD-10-CM | POA: Diagnosis not present

## 2018-12-22 MED ORDER — MIRTAZAPINE 15 MG PO TABS: 15.0000 mg | ORAL_TABLET | Freq: Every day | ORAL | 0 refills | Status: DC

## 2018-12-22 MED ORDER — BUSPIRONE HCL 15 MG PO TABS: 15.0000 mg | ORAL_TABLET | Freq: Every day | ORAL | 0 refills | Status: DC

## 2018-12-22 MED ORDER — LORAZEPAM 0.5 MG PO TABS: 0.5000 mg | ORAL_TABLET | ORAL | 0 refills | Status: DC

## 2018-12-22 MED ORDER — DONEPEZIL HCL 10 MG PO TABS: 10.0000 mg | ORAL_TABLET | Freq: Every day | ORAL | 0 refills | Status: DC

## 2018-12-22 NOTE — Progress Notes (Signed)
[facility]  Patient ID: Heather Avery,  MRN: [id number]  PCP: Jani Gravel, MD  Date of Evaluation: 12/22/2018 Time spent:15 minutes  Chief Complaint:  Chief Complaint    Follow-up     Virtual Visit via Telephone Note  I connected with patient by a video enabled telemedicine application or telephone, with their informed consent, and verified patient privacy and that I am speaking with the correct person using two identifiers.  I am private, in my office and the patient is in [facility].   I discussed the limitations, risks, security and privacy concerns of performing an evaluation and management service by telephone and the availability of in person appointments. I also discussed with the patient that there may be a patient responsible charge related to this service. The patient expressed understanding and agreed to proceed.   I discussed the assessment and treatment plan with the patient. The patient was provided an opportunity to ask questions and all were answered. The patient agreed with the plan and demonstrated an understanding of the instructions.   The patient was advised to call back or seek an in-person evaluation if the symptoms worsen or if the condition fails to improve as anticipated.  I provided 15 minutes of non-face-to-face time during this encounter.  HISTORY/CURRENT STATUS: HPI For routine med check.  [name], nurse, at [facility], is also on phone w/ her.  Transferring to my care after her provider and my colleague, [place] Locket, [place] passed away recently.  States she's doing well.  Feels that her meds are working well.  She works crossword Geographical information systems officer shows.  "I try to do what I can to keep my mind active.  I do not have any complaints.  I think I am doing well."  Patient denies loss of interest in usual activities and is able to enjoy things.  Denies decreased energy or motivation.  Appetite has not changed.  No  extreme sadness, tearfulness, or feelings of hopelessness.  Denies any changes in concentration, making decisions or remembering things.  Denies suicidal or homicidal thoughts.  Feels that her memory is good.  The nurse agrees.  The anxiety is not a big problem.  She does take Lorazepam each morning and states that helps her not be anxious throughout the day.  The BuSpar also helps to prevent it.  Says she sleeps well.  Denies dizziness, syncope, seizures, numbness, tingling, tremor, tics, unsteady gait, slurred speech, confusion. Denies muscle or joint pain, stiffness, or dystonia.  Individual Medical History/ Review of Systems: Changes? :No    Past medications for mental health diagnoses include: unknown  Allergies: Codeine and Hydrocodone  Current Medications:  Current Outpatient Medications:  .  aspirin 81 MG tablet, Take 160 mg by mouth daily.  , Disp: , Rfl:  .  busPIRone (BUSPAR) 15 MG tablet, Take 1 tablet (15 mg total) by mouth daily., Disp: 90 tablet, Rfl: 0 .  clopidogrel (PLAVIX) 75 MG tablet, Take 75 mg by mouth daily.  , Disp: , Rfl:  .  donepezil (ARICEPT) 10 MG tablet, Take 1 tablet (10 mg total) by mouth at bedtime., Disp: 90 tablet, Rfl: 0 .  LORazepam (ATIVAN) 0.5 MG tablet, Take 1 tablet (0.5 mg total) by mouth every morning., Disp: 90 tablet, Rfl: 0 .  mirtazapine (REMERON) 15 MG tablet, Take 1 tablet (15 mg total) by mouth at bedtime., Disp: 90 tablet, Rfl: 0 .  PROAIR HFA 108 (90 Base) MCG/ACT inhaler, ,  Disp: , Rfl:  .  rosuvastatin (CRESTOR) 20 MG tablet, Take 10 mg by mouth at bedtime. , Disp: , Rfl:  .  SYNTHROID 112 MCG tablet, Take 112 mcg by mouth daily., Disp: , Rfl:  .  tiotropium (SPIRIVA HANDIHALER) 18 MCG inhalation capsule, Place 18 mcg into inhaler and inhale daily.  , Disp: , Rfl:  .  triamterene-hydrochlorothiazide (DYAZIDE) 37.5-25 MG per capsule, Take 1 capsule by mouth every morning.  , Disp: , Rfl:  .  HYDROcodone-acetaminophen (NORCO) 5-325 MG per  tablet, Take 1 tablet by mouth every 6 (six) hours as needed. For pain , Disp: , Rfl:  .  nitrofurantoin, macrocrystal-monohydrate, (MACROBID) 100 MG capsule, , Disp: , Rfl:  Medication Side Effects: none  Family Medical/ Social History: Changes? No  MENTAL HEALTH EXAM:  Blood pressure 110/68, pulse 68, temperature 98.1 F (36.7 C), temperature source Temporal, weight 145 lb (65.8 kg), SpO2 97 %.Body mass index is 24.13 kg/m.  General Appearance: unable to assess  Eye Contact:  unable to assess  Speech:  Clear and Coherent  Volume:  Normal  Mood:  Euthymic  Affect:  unable to assess  Thought Process:  Goal Directed  Orientation:  Full (Time, Place, and Person)  Thought Content: Logical   Suicidal Thoughts:  No  Homicidal Thoughts:  No  Memory:  WNL  Judgement:  Good  Insight:  Good  Psychomotor Activity:  unable to assess  Concentration:  Concentration: Good  Recall:  Good  Fund of Knowledge: Good  Language: Good  Assets:  Desire for Improvement  ADL's:  Intact  Cognition: WNL  Prognosis:  Good    DIAGNOSES:    ICD-10-CM   1. Recurrent major depressive disorder, in partial remission ([place])  F33.41   2. Memory loss  R41.3   3. Generalized anxiety disorder  F41.1   4. CLL (chronic lymphocytic leukemia) (HCC)  C91.10 mirtazapine (REMERON) 15 MG tablet    Receiving Psychotherapy: No    RECOMMENDATIONS:  Continue BuSpar 15 mg daily. Continue Aricept 10 mg nightly. Continue Ativan 0.5 mg every morning. Continue mirtazapine 15 mg nightly. Return in 3 months.  Donnal Moat, PA-C   This record has been created using [employer].  Chart creation errors have been sought, but may not always have been located and corrected. Such creation errors do not reflect on the standard of medical care.

## 2018-12-25 ENCOUNTER — Other Ambulatory Visit: Payer: Self-pay | Admitting: Physician Assistant

## 2018-12-25 DIAGNOSIS — C9110 Chronic lymphocytic leukemia of B-cell type not having achieved remission: Secondary | ICD-10-CM

## 2019-03-25 ENCOUNTER — Other Ambulatory Visit: Payer: Self-pay | Admitting: Physician Assistant

## 2019-03-25 DIAGNOSIS — C9110 Chronic lymphocytic leukemia of B-cell type not having achieved remission: Secondary | ICD-10-CM

## 2019-03-25 NOTE — Telephone Encounter (Signed)
Due back now for f/u, sent office staff message for apt

## 2019-04-24 ENCOUNTER — Other Ambulatory Visit: Payer: Self-pay | Admitting: Physician Assistant

## 2019-05-03 ENCOUNTER — Other Ambulatory Visit: Payer: Self-pay

## 2019-05-03 ENCOUNTER — Ambulatory Visit (INDEPENDENT_AMBULATORY_CARE_PROVIDER_SITE_OTHER): Payer: Medicare Other | Admitting: Physician Assistant

## 2019-05-03 ENCOUNTER — Encounter: Payer: Self-pay | Admitting: Physician Assistant

## 2019-05-03 DIAGNOSIS — F411 Generalized anxiety disorder: Secondary | ICD-10-CM

## 2019-05-03 DIAGNOSIS — R413 Other amnesia: Secondary | ICD-10-CM

## 2019-05-03 DIAGNOSIS — F3341 Major depressive disorder, recurrent, in partial remission: Secondary | ICD-10-CM

## 2019-05-03 NOTE — Progress Notes (Signed)
[facility]  Patient ID: Heather Avery,  MRN: [id number]  PCP: Jani Gravel, MD  Date of Evaluation: 05/03/2019 Time spent:20 minutes  Chief Complaint:  Chief Complaint    Anxiety; Depression; Insomnia      HISTORY/CURRENT STATUS: HPI for 51-month med check.  Accompanied by her daughter Heather Avery.  Heather Avery states she is doing very well.  She is tired of being "penned up" because of the coronavirus.  States that today is the first time she has been out in many months.  She lives at [facility] and says she is well taken care of but does not get to be around anybody much.  They have to be quarantined in their room if they go out anywhere.  She knows that once she gets home today she will have to be in her room for 10 days.  States that it is worth it just to get out for a little while today.  Both she and her daughter feel like the medications are working well.  She is able to enjoy things.  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  Energy and motivation are good "for my age."  She does not cry easily.  No suicidal or homicidal thoughts.  Her memory is about the same.  Heather Avery feels that his about where she would expect it to be at her age.  Heather Avery says that her mom had Alzheimer's and she does not want to get that herself.  She sleeps good most of the time.  Anxiety is well controlled.  She takes the Ativan every morning or else she feels anxious all throughout the day.    Her physical health problems are stable. Denies dizziness, syncope, seizures, numbness, tingling, tremor, tics, unsteady gait, slurred speech, confusion. Denies muscle or joint pain, stiffness, or dystonia.  Individual Medical History/ Review of Systems: Changes? :No    Past medications for mental health diagnoses include: unknown  Allergies: Codeine and Hydrocodone  Current Medications:  Current Outpatient Medications:  .  aspirin 81 MG tablet, Take 160 mg by mouth daily.  , Disp:  , Rfl:  .  busPIRone (BUSPAR) 15 MG tablet, Take 1 tablet by mouth once daily, Disp: 90 tablet, Rfl: 0 .  clopidogrel (PLAVIX) 75 MG tablet, Take 75 mg by mouth daily.  , Disp: , Rfl:  .  donepezil (ARICEPT) 10 MG tablet, TAKE 1 TABLET BY MOUTH AT BEDTIME, Disp: 90 tablet, Rfl: 0 .  LORazepam (ATIVAN) 0.5 MG tablet, TAKE 1 TABLET BY MOUTH IN THE MORNING, Disp: 90 tablet, Rfl: 0 .  mirtazapine (REMERON) 15 MG tablet, TAKE 1 TABLET BY MOUTH AT BEDTIME, Disp: 90 tablet, Rfl: 0 .  PROAIR HFA 108 (90 Base) MCG/ACT inhaler, , Disp: , Rfl:  .  rosuvastatin (CRESTOR) 20 MG tablet, Take 10 mg by mouth at bedtime. , Disp: , Rfl:  .  SYNTHROID 112 MCG tablet, Take 112 mcg by mouth daily., Disp: , Rfl:  .  tiotropium (SPIRIVA HANDIHALER) 18 MCG inhalation capsule, Place 18 mcg into inhaler and inhale daily.  , Disp: , Rfl:  .  triamterene-hydrochlorothiazide (DYAZIDE) 37.5-25 MG per capsule, Take 1 capsule by mouth every morning.  , Disp: , Rfl:  .  HYDROcodone-acetaminophen (NORCO) 5-325 MG per tablet, Take 1 tablet by mouth every 6 (six) hours as needed. For pain , Disp: , Rfl:  .  nitrofurantoin, macrocrystal-monohydrate, (MACROBID) 100 MG capsule, , Disp: , Rfl:  Medication Side Effects: none  Family  Medical/ Social History: Changes? No  MENTAL HEALTH EXAM:  There were no vitals taken for this visit.There is no height or weight on file to calculate BMI.  General Appearance: Casual, Neat and Well Groomed  Eye Contact:  Good  Speech:  Clear and Coherent  Volume:  Normal  Mood:  Euthymic  Affect:  Appropriate  Thought Process:  Goal Directed  Orientation:  Full (Time, Place, and Person)  Thought Content: Logical   Suicidal Thoughts:  No  Homicidal Thoughts:  No  Memory:  Recent;   Fair Remote;   Fair  Judgement:  Good  Insight:  Good  Psychomotor Activity:  Normal  Concentration:  Concentration: Good  Recall:  Good  Fund of Knowledge: Good  Language: Good  Assets:  Desire for Improvement   ADL's:  Intact  Cognition: WNL  Prognosis:  Good    DIAGNOSES:    ICD-10-CM   1. Recurrent major depressive disorder, in partial remission ([place])  F33.41   2. Generalized anxiety disorder  F41.1   3. Memory loss  R41.3     Receiving Psychotherapy: No    RECOMMENDATIONS:  I am glad to see her doing so well. PDMP was reviewed. Continue BuSpar 15 mg 1 daily. Continue Aricept 10 mg nightly. Continue Ativan 0.5 mg every morning. Continue mirtazapine 15 mg nightly. Return in 3 months.  Donnal Moat, PA-C

## 2019-06-15 ENCOUNTER — Other Ambulatory Visit: Payer: Self-pay | Admitting: Physician Assistant

## 2019-06-15 DIAGNOSIS — C9110 Chronic lymphocytic leukemia of B-cell type not having achieved remission: Secondary | ICD-10-CM

## 2019-07-02 ENCOUNTER — Other Ambulatory Visit: Payer: Self-pay | Admitting: Physician Assistant

## 2019-07-02 NOTE — Telephone Encounter (Signed)
Next apt 08/02/2019

## 2019-07-12 DIAGNOSIS — Z0000 Encounter for general adult medical examination without abnormal findings: Secondary | ICD-10-CM | POA: Diagnosis not present

## 2019-07-12 DIAGNOSIS — E039 Hypothyroidism, unspecified: Secondary | ICD-10-CM | POA: Diagnosis not present

## 2019-07-12 DIAGNOSIS — E7800 Pure hypercholesterolemia, unspecified: Secondary | ICD-10-CM | POA: Diagnosis not present

## 2019-07-14 ENCOUNTER — Other Ambulatory Visit: Payer: Self-pay | Admitting: *Deleted

## 2019-07-14 ENCOUNTER — Other Ambulatory Visit: Payer: Self-pay | Admitting: Internal Medicine

## 2019-07-14 ENCOUNTER — Other Ambulatory Visit: Payer: Self-pay | Admitting: Physician Assistant

## 2019-07-14 ENCOUNTER — Telehealth: Payer: Self-pay | Admitting: *Deleted

## 2019-07-14 DIAGNOSIS — C9110 Chronic lymphocytic leukemia of B-cell type not having achieved remission: Secondary | ICD-10-CM

## 2019-07-14 NOTE — Telephone Encounter (Signed)
Patients daughter called to set up routine follow up appt to see Dr. Julien Nordmann with labs.  Last LOS was from 08/05/2017 (labs/md).  Routed scheduling message to schedule.  Need labs added since previous labs expired.  Routed to MD.

## 2019-07-15 ENCOUNTER — Telehealth: Payer: Self-pay | Admitting: Internal Medicine

## 2019-07-15 NOTE — Telephone Encounter (Signed)
Scheduled apt per 4/7 sch message - per daughter schedule I May due to busy schedule in April - she is aware of appt date and time

## 2019-07-21 DIAGNOSIS — Z85828 Personal history of other malignant neoplasm of skin: Secondary | ICD-10-CM | POA: Diagnosis not present

## 2019-07-21 DIAGNOSIS — L981 Factitial dermatitis: Secondary | ICD-10-CM | POA: Diagnosis not present

## 2019-07-21 DIAGNOSIS — L820 Inflamed seborrheic keratosis: Secondary | ICD-10-CM | POA: Diagnosis not present

## 2019-07-21 DIAGNOSIS — Z08 Encounter for follow-up examination after completed treatment for malignant neoplasm: Secondary | ICD-10-CM | POA: Diagnosis not present

## 2019-08-02 ENCOUNTER — Encounter: Payer: Self-pay | Admitting: Physician Assistant

## 2019-08-02 ENCOUNTER — Other Ambulatory Visit: Payer: Self-pay

## 2019-08-02 ENCOUNTER — Ambulatory Visit (INDEPENDENT_AMBULATORY_CARE_PROVIDER_SITE_OTHER): Payer: Medicare Other | Admitting: Physician Assistant

## 2019-08-02 DIAGNOSIS — F411 Generalized anxiety disorder: Secondary | ICD-10-CM

## 2019-08-02 DIAGNOSIS — F3341 Major depressive disorder, recurrent, in partial remission: Secondary | ICD-10-CM

## 2019-08-02 DIAGNOSIS — G4700 Insomnia, unspecified: Secondary | ICD-10-CM | POA: Diagnosis not present

## 2019-08-02 DIAGNOSIS — R413 Other amnesia: Secondary | ICD-10-CM

## 2019-08-02 MED ORDER — DONEPEZIL HCL 10 MG PO TABS: 10.0000 mg | ORAL_TABLET | Freq: Every day | ORAL | 1 refills | Status: DC

## 2019-08-02 NOTE — Progress Notes (Signed)
[facility]  Patient ID: Heather Avery,  MRN: [id number]  PCP: Jani Gravel, MD  Date of Evaluation: 08/02/2019 Time spent:20 minutes  Chief Complaint:  Chief Complaint    Anxiety; Insomnia      HISTORY/CURRENT STATUS: HPI For routine med check.    Heather Avery is here for routine med check, accompanied by her daughter Heather Avery.  Heather Avery states that she her Mom is especially alert today and seems to be feeling well.  Heather Avery says she is doing well, feels good physically and she thinks her mind is okay to, she says jokingly.  She is able to enjoy things.  She goes out on her balcony at the assisted living home and smokes.  She also works crossword puzzles.  She sleeps well most of the time.  Energy and motivation are good.  No SI or HI.  Patient states she does have anxiety at times.  "I think everybody does."  She takes 1 lorazepam in the morning and that is all she needs.  It helps her to feel better get through the day.  No complaints of panic attacks.  Heather Avery states Heather Avery is still having memory issues, but they do not seem any worse.  Patient states working a crossword puzzles helps keep her mind "activated."  Denies dizziness, syncope, seizures, numbness, tingling, tremor, tics, unsteady gait, slurred speech, confusion. Denies muscle or joint pain, stiffness, or dystonia.  Individual Medical History/ Review of Systems: Changes? :No    Past medications for mental health diagnoses include: Zoloft, Celexa, Effexor, Paxil, Trintellix, Remeron, Ativan, Viibryd, Cymbalta, Wellbutrin, Ativan, BuSpar  Allergies: Codeine and Hydrocodone  Current Medications:  Current Outpatient Medications:  .  aspirin 81 MG tablet, Take 160 mg by mouth daily.  , Disp: , Rfl:  .  busPIRone (BUSPAR) 15 MG tablet, Take 1 tablet by mouth once daily, Disp: 90 tablet, Rfl: 0 .  clopidogrel (PLAVIX) 75 MG tablet, Take 75 mg by mouth daily.  , Disp: , Rfl:  .  donepezil (ARICEPT) 10 MG  tablet, Take 1 tablet (10 mg total) by mouth at bedtime., Disp: 90 tablet, Rfl: 1 .  LORazepam (ATIVAN) 0.5 MG tablet, TAKE 1 TABLET BY MOUTH IN THE MORNING, Disp: 90 tablet, Rfl: 0 .  mirtazapine (REMERON) 15 MG tablet, Take 15 mg by mouth at bedtime., Disp: , Rfl:  .  PROAIR HFA 108 (90 Base) MCG/ACT inhaler, , Disp: , Rfl:  .  rosuvastatin (CRESTOR) 20 MG tablet, Take 10 mg by mouth at bedtime. , Disp: , Rfl:  .  SYNTHROID 112 MCG tablet, Take 112 mcg by mouth daily., Disp: , Rfl:  .  tiotropium (SPIRIVA HANDIHALER) 18 MCG inhalation capsule, Place 18 mcg into inhaler and inhale daily.  , Disp: , Rfl:  .  triamterene-hydrochlorothiazide (DYAZIDE) 37.5-25 MG per capsule, Take 1 capsule by mouth every morning.  , Disp: , Rfl:  .  HYDROcodone-acetaminophen (NORCO) 5-325 MG per tablet, Take 1 tablet by mouth every 6 (six) hours as needed. For pain , Disp: , Rfl:  .  nitrofurantoin, macrocrystal-monohydrate, (MACROBID) 100 MG capsule, , Disp: , Rfl:  Medication Side Effects: none  Family Medical/ Social History: Changes? No  MENTAL HEALTH EXAM:  There were no vitals taken for this visit.There is no height or weight on file to calculate BMI.  General Appearance: Casual, Neat and Well Groomed  Eye Contact:  Good  Speech:  Clear and Coherent and Normal Rate  Volume:  Normal  Mood:  Euthymic  Affect:  Appropriate  Thought Process:  Goal Directed and Descriptions of Associations: Intact  Orientation:  Full (Time, Place, and Person)  Thought Content: Logical   Suicidal Thoughts:  No  Homicidal Thoughts:  No  Memory:  Immediate, recent and remote memory are all fair depending on the day.   Judgement:  Good  Insight:  Good  Psychomotor Activity:  Normal  Concentration:  Concentration: Good  Recall:  Good  Fund of Knowledge: Good  Language: Good  Assets:  Desire for Improvement  ADL's:  Intact  Cognition: WNL  Prognosis:  Good    DIAGNOSES:    ICD-10-CM   1. Recurrent major  depressive disorder, in partial remission ([place])  F33.41   2. Generalized anxiety disorder  F41.1   3. Memory loss  R41.3   4. Insomnia, unspecified type  G47.00     Receiving Psychotherapy: No    RECOMMENDATIONS:  PDMP was reviewed. I spent 20 minutes with her. I am glad to see her doing so well! Continue BuSpar 15 mg, 1 p.o. daily. Continue Aricept 10 mg, 1 p.o. nightly. Continue Ativan 0.5 mg, 1 p.o. daily. Continue mirtazapine 15 mg, 1 p.o. nightly. Return in 6 months.  Donnal Moat, PA-C

## 2019-08-09 ENCOUNTER — Inpatient Hospital Stay: Payer: Medicare Other

## 2019-08-09 ENCOUNTER — Encounter: Payer: Self-pay | Admitting: Internal Medicine

## 2019-08-09 ENCOUNTER — Telehealth: Payer: Self-pay | Admitting: Internal Medicine

## 2019-08-09 ENCOUNTER — Other Ambulatory Visit: Payer: Self-pay

## 2019-08-09 ENCOUNTER — Inpatient Hospital Stay: Payer: Medicare Other | Attending: Internal Medicine | Admitting: Internal Medicine

## 2019-08-09 VITALS — BP 136/68 | HR 65 | Temp 98.2°F | Resp 20 | Ht 65.0 in | Wt 153.8 lb

## 2019-08-09 DIAGNOSIS — Z8673 Personal history of transient ischemic attack (TIA), and cerebral infarction without residual deficits: Secondary | ICD-10-CM | POA: Insufficient documentation

## 2019-08-09 DIAGNOSIS — H5461 Unqualified visual loss, right eye, normal vision left eye: Secondary | ICD-10-CM | POA: Diagnosis not present

## 2019-08-09 DIAGNOSIS — Z7982 Long term (current) use of aspirin: Secondary | ICD-10-CM | POA: Diagnosis not present

## 2019-08-09 DIAGNOSIS — J449 Chronic obstructive pulmonary disease, unspecified: Secondary | ICD-10-CM | POA: Insufficient documentation

## 2019-08-09 DIAGNOSIS — C9110 Chronic lymphocytic leukemia of B-cell type not having achieved remission: Secondary | ICD-10-CM | POA: Insufficient documentation

## 2019-08-09 DIAGNOSIS — I10 Essential (primary) hypertension: Secondary | ICD-10-CM | POA: Insufficient documentation

## 2019-08-09 DIAGNOSIS — Z79899 Other long term (current) drug therapy: Secondary | ICD-10-CM | POA: Insufficient documentation

## 2019-08-09 DIAGNOSIS — Z85828 Personal history of other malignant neoplasm of skin: Secondary | ICD-10-CM | POA: Diagnosis not present

## 2019-08-09 DIAGNOSIS — Z806 Family history of leukemia: Secondary | ICD-10-CM | POA: Insufficient documentation

## 2019-08-09 DIAGNOSIS — I6529 Occlusion and stenosis of unspecified carotid artery: Secondary | ICD-10-CM | POA: Diagnosis not present

## 2019-08-09 LAB — CBC WITH DIFFERENTIAL (CANCER CENTER ONLY)
Abs Immature Granulocytes: 0.02 10*3/uL (ref 0.00–0.07)
Basophils Absolute: 0 10*3/uL (ref 0.0–0.1)
Basophils Relative: 0 %
Eosinophils Absolute: 0.2 10*3/uL (ref 0.0–0.5)
Eosinophils Relative: 1 %
HCT: 39.5 % (ref 36.0–46.0)
Hemoglobin: 12.6 g/dL (ref 12.0–15.0)
Immature Granulocytes: 0 %
Lymphocytes Relative: 65 %
Lymphs Abs: 7.4 10*3/uL — ABNORMAL HIGH (ref 0.7–4.0)
MCH: 30.1 pg (ref 26.0–34.0)
MCHC: 31.9 g/dL (ref 30.0–36.0)
MCV: 94.5 fL (ref 80.0–100.0)
Monocytes Absolute: 0.7 10*3/uL (ref 0.1–1.0)
Monocytes Relative: 6 %
Neutro Abs: 3.2 10*3/uL (ref 1.7–7.7)
Neutrophils Relative %: 28 %
Platelet Count: 180 10*3/uL (ref 150–400)
RBC: 4.18 MIL/uL (ref 3.87–5.11)
RDW: 13.9 % (ref 11.5–15.5)
WBC Count: 11.4 10*3/uL — ABNORMAL HIGH (ref 4.0–10.5)
nRBC: 0 % (ref 0.0–0.2)

## 2019-08-09 LAB — CMP (CANCER CENTER ONLY)
ALT: 17 U/L (ref 0–44)
AST: 23 U/L (ref 15–41)
Albumin: 4.3 g/dL (ref 3.5–5.0)
Alkaline Phosphatase: 61 U/L (ref 38–126)
Anion gap: 13 (ref 5–15)
BUN: 14 mg/dL (ref 8–23)
CO2: 31 mmol/L (ref 22–32)
Calcium: 9 mg/dL (ref 8.9–10.3)
Chloride: 100 mmol/L (ref 98–111)
Creatinine: 1.03 mg/dL — ABNORMAL HIGH (ref 0.44–1.00)
GFR, Est AFR Am: 57 mL/min — ABNORMAL LOW (ref >60)
GFR, Estimated: 49 mL/min — ABNORMAL LOW (ref >60)
Glucose, Bld: 95 mg/dL (ref 70–99)
Potassium: 3.3 mmol/L — ABNORMAL LOW (ref 3.5–5.1)
Sodium: 144 mmol/L (ref 135–145)
Total Bilirubin: 0.6 mg/dL (ref 0.3–1.2)
Total Protein: 7.2 g/dL (ref 6.5–8.1)

## 2019-08-09 LAB — LACTATE DEHYDROGENASE: LDH: 152 U/L (ref 98–192)

## 2019-08-09 NOTE — Telephone Encounter (Signed)
Scheduled appt per 5/3 los - gave patient AVS

## 2019-08-09 NOTE — Progress Notes (Signed)
[place] [phone]   [phone]  OFFICE PROGRESS NOTE  DIAGNOSIS: Stage 0 chronic lymphocytic leukemia diagnosed in August of 2003   PRIOR THERAPY: None   CURRENT THERAPY: Observation.  INTERVAL HISTORY: Heather Avery 86 y.o. female returns to the clinic today for follow-up visit accompanied by her daughter.  The patient was last seen 2 years ago.  She missed her appointment last year because of the COVID-19.  She is currently a resident of the [facility] assisted living facility.  She denied having any current chest pain, shortness of breath, cough or hemoptysis.  She denied having any fever or chills.  She has no nausea, vomiting, diarrhea or constipation.  She denied having any headache or visual changes.  She is here today for evaluation and repeat blood work.  MEDICAL HISTORY: Past Medical History:  Diagnosis Date  . Blind right eye   . Carotid artery occlusion   . COPD (chronic obstructive pulmonary disease) ([place])   . Degeneration of intervertebral disc   . Hearing loss in left ear   . Hypertension   . Leukemia ([place])   . Skin cancer   . Stroke [facility])     ALLERGIES:  is allergic to codeine and hydrocodone.  MEDICATIONS:  Current Outpatient Medications  Medication Sig Dispense Refill  . aspirin 81 MG tablet Take 160 mg by mouth daily.      . busPIRone (BUSPAR) 15 MG tablet Take 1 tablet by mouth once daily 90 tablet 0  . clopidogrel (PLAVIX) 75 MG tablet Take 75 mg by mouth daily.      Marland Kitchen donepezil (ARICEPT) 10 MG tablet Take 1 tablet (10 mg total) by mouth at bedtime. 90 tablet 1  . LORazepam (ATIVAN) 0.5 MG tablet TAKE 1 TABLET BY MOUTH IN THE MORNING 90 tablet 0  . mirtazapine (REMERON) 15 MG tablet Take 15 mg by mouth at bedtime.    Marland Kitchen PROAIR HFA 108 (90 Base) MCG/ACT inhaler     . rosuvastatin (CRESTOR) 10 MG tablet Take 10 mg by mouth daily.    Marland Kitchen SYNTHROID 112 MCG tablet Take 112 mcg by mouth daily.    Marland Kitchen tiotropium  (SPIRIVA HANDIHALER) 18 MCG inhalation capsule Place 18 mcg into inhaler and inhale daily.      Marland Kitchen triamterene-hydrochlorothiazide (DYAZIDE) 37.5-25 MG per capsule Take 1 capsule by mouth every morning.       No current facility-administered medications for this visit.    REVIEW OF SYSTEMS:  A comprehensive review of systems was negative.   PHYSICAL EXAMINATION: General appearance: alert, cooperative and no distress Head: Normocephalic, without obvious abnormality, atraumatic Neck: no adenopathy Lymph nodes: Cervical, supraclavicular, and axillary nodes normal. Resp: clear to auscultation bilaterally Back: symmetric, no curvature. ROM normal. No CVA tenderness. Cardio: regular rate and rhythm, S1, S2 normal, no murmur, click, rub or gallop GI: soft, non-tender; bowel sounds normal; no masses,  no organomegaly Extremities: extremities normal, atraumatic, no cyanosis or edema  ECOG PERFORMANCE STATUS: 0 - Asymptomatic  Blood pressure 136/68, pulse 65, temperature 98.2 F (36.8 C), temperature source Temporal, resp. rate 20, height 5\' 5"  (1.651 m), weight 153 lb 12.8 oz (69.8 kg), SpO2 96 %.  LABORATORY DATA: Lab Results  Component Value Date   WBC 11.4 (H) 08/09/2019   HGB 12.6 08/09/2019   HCT 39.5 08/09/2019   MCV 94.5 08/09/2019   PLT 180 08/09/2019      Chemistry      Component  Value Date/Time   NA 138 08/05/2017 1455   NA 138 08/06/2016 1447   K 3.1 (L) 08/05/2017 1455   K 3.9 08/06/2016 1447   CL 100 08/05/2017 1455   CL 99 09/14/2012 1500   CO2 29 08/05/2017 1455   CO2 31 (H) 08/06/2016 1447   BUN 11 08/05/2017 1455   BUN 13.0 08/06/2016 1447   CREATININE 0.91 08/05/2017 1455   CREATININE 0.9 08/06/2016 1447      Component Value Date/Time   CALCIUM 9.5 08/05/2017 1455   CALCIUM 9.5 08/06/2016 1447   ALKPHOS 65 08/05/2017 1455   ALKPHOS 66 08/06/2016 1447   AST 27 08/05/2017 1455   AST 27 08/06/2016 1447   ALT 22 08/05/2017 1455   ALT 27 08/06/2016 1447    BILITOT 0.7 08/05/2017 1455   BILITOT 0.75 08/06/2016 1447       RADIOGRAPHIC STUDIES: No results found.  ASSESSMENT AND PLAN:  This is a very pleasant 85 years old white female with chronic lymphocytic leukemia diagnosed in August 2003. The patient has been in observation since that time and she is doing fine. Repeat CBC today showed further improvement of her total white blood count and currently down to 11.4. The patient is asymptomatic.  I recommended for her to continue on observation with repeat CBC, comprehensive metabolic panel and LDH in 1 year. She was advised to call immediately if she has any concerning symptoms in the interval. All questions were answered. The patient knows to call the clinic with any problems, questions or concerns. We can certainly see the patient much sooner if necessary.  Disclaimer: This note was dictated with voice recognition software. Similar sounding words can inadvertently be transcribed and may not be corrected upon review.

## 2019-09-07 DIAGNOSIS — H5340 Unspecified visual field defects: Secondary | ICD-10-CM | POA: Diagnosis not present

## 2019-09-19 ENCOUNTER — Other Ambulatory Visit: Payer: Self-pay | Admitting: Physician Assistant

## 2019-09-20 NOTE — Telephone Encounter (Signed)
Next apt 02/01/2020

## 2019-10-10 ENCOUNTER — Other Ambulatory Visit: Payer: Self-pay | Admitting: Physician Assistant

## 2019-12-20 ENCOUNTER — Other Ambulatory Visit: Payer: Self-pay | Admitting: Physician Assistant

## 2019-12-20 NOTE — Telephone Encounter (Signed)
Next apt 10/26

## 2019-12-22 ENCOUNTER — Other Ambulatory Visit: Payer: Self-pay | Admitting: Physician Assistant

## 2019-12-23 NOTE — Telephone Encounter (Signed)
Next apt 10/26

## 2020-01-03 ENCOUNTER — Other Ambulatory Visit: Payer: Self-pay | Admitting: Physician Assistant

## 2020-01-04 NOTE — Telephone Encounter (Signed)
review 

## 2020-01-10 DIAGNOSIS — E7800 Pure hypercholesterolemia, unspecified: Secondary | ICD-10-CM | POA: Diagnosis not present

## 2020-01-13 DIAGNOSIS — J449 Chronic obstructive pulmonary disease, unspecified: Secondary | ICD-10-CM | POA: Diagnosis not present

## 2020-01-13 DIAGNOSIS — Z23 Encounter for immunization: Secondary | ICD-10-CM | POA: Diagnosis not present

## 2020-01-13 DIAGNOSIS — I639 Cerebral infarction, unspecified: Secondary | ICD-10-CM | POA: Diagnosis not present

## 2020-01-13 DIAGNOSIS — E039 Hypothyroidism, unspecified: Secondary | ICD-10-CM | POA: Diagnosis not present

## 2020-01-13 DIAGNOSIS — I10 Essential (primary) hypertension: Secondary | ICD-10-CM | POA: Diagnosis not present

## 2020-01-13 DIAGNOSIS — E7800 Pure hypercholesterolemia, unspecified: Secondary | ICD-10-CM | POA: Diagnosis not present

## 2020-02-01 ENCOUNTER — Encounter: Payer: Self-pay | Admitting: Physician Assistant

## 2020-02-01 ENCOUNTER — Ambulatory Visit (INDEPENDENT_AMBULATORY_CARE_PROVIDER_SITE_OTHER): Payer: Medicare Other | Admitting: Physician Assistant

## 2020-02-01 ENCOUNTER — Other Ambulatory Visit: Payer: Self-pay

## 2020-02-01 DIAGNOSIS — G4700 Insomnia, unspecified: Secondary | ICD-10-CM | POA: Diagnosis not present

## 2020-02-01 DIAGNOSIS — R413 Other amnesia: Secondary | ICD-10-CM | POA: Diagnosis not present

## 2020-02-01 DIAGNOSIS — F411 Generalized anxiety disorder: Secondary | ICD-10-CM | POA: Diagnosis not present

## 2020-02-01 MED ORDER — DONEPEZIL HCL 10 MG PO TABS: 10.0000 mg | ORAL_TABLET | Freq: Every day | ORAL | 1 refills | Status: DC

## 2020-02-01 NOTE — Progress Notes (Signed)
[facility]  Patient ID: Heather Avery,  MRN: [id number]  PCP: Heather Gravel, MD  Date of Evaluation: 02/01/2020 Time spent:20 minutes  Chief Complaint:  Chief Complaint    Anxiety; Depression       HISTORY/CURRENT STATUS: HPI For routine med check.    Heather Avery is here for routine med check, accompanied by her daughter Heather Avery.  Patient says she is doing well, feels good physically and she thinks her mind is okay too. She is able to enjoy things.  Continues to work on crossword puzzles.  "It keeps my mind young."  Heather Avery says the memory issues are about the same.  She sleeps well most of the time.  Energy and motivation are good.  No SI or HI.  Patient states she does have anxiety at times.  She takes 1 Ativan every morning and that keeps her from being anxious during the day.  She does continue to smoke maybe 1 to 2 cigarettes/day but that is all.  The inhalers still help when needed.  She has an appointment with pulmonologist in the next month or so.  Heather Avery says she probably will need oxygen but patient already is refusing.  Denies dizziness, syncope, seizures, numbness, tingling, tremor, tics, unsteady gait, slurred speech, confusion. Denies muscle or joint pain, stiffness, or dystonia.  Individual Medical History/ Review of Systems: Changes? :No    Past medications for mental health diagnoses include: Zoloft, Celexa, Effexor, Paxil, Trintellix, Remeron, Ativan, Viibryd, Cymbalta, Wellbutrin, Ativan, BuSpar  Allergies: Codeine and Hydrocodone  Current Medications:  Current Outpatient Medications:  .  aspirin 81 MG tablet, Take 160 mg by mouth daily.  , Disp: , Rfl:  .  busPIRone (BUSPAR) 15 MG tablet, Take 1 tablet by mouth once daily, Disp: 90 tablet, Rfl: 0 .  clopidogrel (PLAVIX) 75 MG tablet, Take 75 mg by mouth daily.  , Disp: , Rfl:  .  donepezil (ARICEPT) 10 MG tablet, Take 1 tablet (10 mg total) by mouth at bedtime., Disp: 90 tablet, Rfl: 1 .  LORazepam  (ATIVAN) 0.5 MG tablet, TAKE 1 TABLET BY MOUTH IN THE MORNING, Disp: 90 tablet, Rfl: 0 .  mirtazapine (REMERON) 15 MG tablet, TAKE 1 TABLET BY MOUTH AT BEDTIME, Disp: 90 tablet, Rfl: 0 .  PROAIR HFA 108 (90 Base) MCG/ACT inhaler, , Disp: , Rfl:  .  rosuvastatin (CRESTOR) 10 MG tablet, Take 10 mg by mouth daily., Disp: , Rfl:  .  SYNTHROID 112 MCG tablet, Take 112 mcg by mouth daily., Disp: , Rfl:  .  tiotropium (SPIRIVA HANDIHALER) 18 MCG inhalation capsule, Place 18 mcg into inhaler and inhale daily.  , Disp: , Rfl:  .  triamterene-hydrochlorothiazide (DYAZIDE) 37.5-25 MG per capsule, Take 1 capsule by mouth every morning.  , Disp: , Rfl:  Medication Side Effects: none  Family Medical/ Social History: Changes? No  MENTAL HEALTH EXAM:  There were no vitals taken for this visit.There is no height or weight on file to calculate BMI.  General Appearance: Casual, Neat and Well Groomed appears much younger than stated age.  Eye Contact:  Good  Speech:  Clear and Coherent and Normal Rate  Volume:  Normal  Mood:  Euthymic  Affect:  Appropriate  Thought Process:  Goal Directed and Descriptions of Associations: Intact  Orientation:  Full (Time, Place, and Person)  Thought Content: Logical   Suicidal Thoughts:  No  Homicidal Thoughts:  No  Memory:  Immediate, recent and remote memory are all fair depending  on the day.   Judgement:  Good  Insight:  Good  Psychomotor Activity:  Normal  Concentration:  Concentration: Good  Recall:  Good  Fund of Knowledge: Good  Language: Good  Assets:  Desire for Improvement  ADL's:  Intact  Cognition: WNL  Prognosis:  Good    DIAGNOSES:    ICD-10-CM   1. Generalized anxiety disorder  F41.1   2. Memory loss  R41.3   3. Insomnia, unspecified type  G47.00     Receiving Psychotherapy: No    RECOMMENDATIONS:  PDMP was reviewed. I provided 20 minutes of face-to-face time during this encounter. I am glad to see her doing so well! Continue BuSpar 15  mg, 1 p.o. daily. Continue Aricept 10 mg, 1 p.o. nightly. Continue Ativan 0.5 mg, 1 p.o. daily. Continue mirtazapine 15 mg, 1 p.o. nightly. Return in 6 months.  Heather Moat, PA-C

## 2020-02-14 ENCOUNTER — Encounter: Payer: Self-pay | Admitting: Pulmonary Disease

## 2020-02-14 ENCOUNTER — Other Ambulatory Visit: Payer: Self-pay

## 2020-02-14 ENCOUNTER — Ambulatory Visit: Payer: Medicare Other | Admitting: Pulmonary Disease

## 2020-02-14 VITALS — BP 118/62 | HR 69 | Temp 98.2°F | Ht 66.0 in | Wt 154.2 lb

## 2020-02-14 DIAGNOSIS — J449 Chronic obstructive pulmonary disease, unspecified: Secondary | ICD-10-CM

## 2020-02-14 MED ORDER — BEVESPI AEROSPHERE 9-4.8 MCG/ACT IN AERO: 2.0000 | INHALATION_SPRAY | Freq: Two times a day (BID) | RESPIRATORY_TRACT | 6 refills | Status: DC

## 2020-02-14 NOTE — Patient Instructions (Signed)
Start bevespi inhaler 2 puffs twice daily Stop Spiriva inhaler Continue to use albuterol as needed every 4-6 hours Follow up in 6 months

## 2020-02-14 NOTE — Progress Notes (Signed)
Synopsis: Referred by Jani Gravel, MD for COPD  Subjective:   PATIENT ID: Heather Avery GENDER: female DOB: 25-Jun-1930, MRN: [id number]   HPI  Chief Complaint  Patient presents with  . Consult    SOB Looking for other medication options. Currently on Chloride is an 86 year old woman, daily smoker with history of dementia, CVA, chronic lymphocytic leukemia, hypertension and COPD who is referred to pulmonary clinic for evaluation of COPD.   She is accompanied by her daughter, Nanetta Batty for today's visit. The patient lives alone in a retirement community and is independent in her daily activities. Her daughter checks in on her frequently and helps organize her medications for the week. The patient is currently using spiriva handihaler daily in which the daughter reports she will take multiple puffs throughout the day in order to inhale all of the medication. She also uses albuterol inhaler as needed. The patient will be extremely winded and fatigued after taking her trash out. Her daughter reports she is too demented to understand how short of breath she is with exertion.   The patient does report a cough that is sometimes productive. She continues to smoke a few cigarettes per day.    Past Medical History:  Diagnosis Date  . Blind right eye   . Carotid artery occlusion   . COPD (chronic obstructive pulmonary disease) ([place])   . Degeneration of intervertebral disc   . Hearing loss in left ear   . Hypertension   . Leukemia ([place])   . Skin cancer   . Stroke [facility])      History reviewed. No pertinent family history.   Social History   Socioeconomic History  . Marital status: Married    Spouse name: Not on file  . Number of children: Not on file  . Years of education: Not on file  . Highest education level: Not on file  Occupational History  . Not on file  Tobacco Use  . Smoking status: Current Every Day Smoker    Packs/day: 0.10    Types: Cigarettes  .  Smokeless tobacco: Never Used  Substance and Sexual Activity  . Alcohol use: Not Currently  . Drug use: No  . Sexual activity: Not Currently  Other Topics Concern  . Not on file  Social History Narrative  . Not on file   Social Determinants of Health   Financial Resource Strain:   . Difficulty of Paying Living Expenses: Not on file  Food Insecurity:   . Worried About Charity fundraiser in the Last Year: Not on file  . Ran Out of Food in the Last Year: Not on file  Transportation Needs:   . Lack of Transportation (Medical): Not on file  . Lack of Transportation (Non-Medical): Not on file  Physical Activity:   . Days of Exercise per Week: Not on file  . Minutes of Exercise per Session: Not on file  Stress:   . Feeling of Stress : Not on file  Social Connections:   . Frequency of Communication with Friends and Family: Not on file  . Frequency of Social Gatherings with Friends and Family: Not on file  . Attends Religious Services: Not on file  . Active Member of Clubs or Organizations: Not on file  . Attends Archivist Meetings: Not on file  . Marital Status: Not on file  Intimate Partner Violence:   . Fear of Current or Ex-Partner: Not on file  .  Emotionally Abused: Not on file  . Physically Abused: Not on file  . Sexually Abused: Not on file     Allergies  Allergen Reactions  . Codeine   . Hydrocodone Other (See Comments)    "Feels nervous and anxious".   Patient takes as needed     Outpatient Medications Prior to Visit  Medication Sig Dispense Refill  . aspirin 81 MG tablet Take 160 mg by mouth daily.      . busPIRone (BUSPAR) 15 MG tablet Take 1 tablet by mouth once daily 90 tablet 0  . clopidogrel (PLAVIX) 75 MG tablet Take 75 mg by mouth daily.      Marland Kitchen donepezil (ARICEPT) 10 MG tablet Take 1 tablet (10 mg total) by mouth at bedtime. 90 tablet 1  . LORazepam (ATIVAN) 0.5 MG tablet TAKE 1 TABLET BY MOUTH IN THE MORNING 90 tablet 0  . mirtazapine  (REMERON) 15 MG tablet TAKE 1 TABLET BY MOUTH AT BEDTIME 90 tablet 0  . PROAIR HFA 108 (90 Base) MCG/ACT inhaler     . rosuvastatin (CRESTOR) 10 MG tablet Take 10 mg by mouth daily.    Marland Kitchen SYNTHROID 112 MCG tablet Take 112 mcg by mouth daily.    Marland Kitchen triamterene-hydrochlorothiazide (DYAZIDE) 37.5-25 MG per capsule Take 1 capsule by mouth every morning.      . tiotropium (SPIRIVA HANDIHALER) 18 MCG inhalation capsule Place 18 mcg into inhaler and inhale daily.       No facility-administered medications prior to visit.    Review of Systems  Constitutional: Negative for chills, diaphoresis, fever, malaise/fatigue and weight loss.  HENT: Negative for congestion, sinus pain and sore throat.   Eyes: Negative.   Respiratory: Positive for cough and shortness of breath. Negative for hemoptysis, sputum production and wheezing.   Cardiovascular: Negative for chest pain, orthopnea, leg swelling and PND.  Gastrointestinal: Negative for heartburn.  Genitourinary: Negative.   Musculoskeletal: Negative.   Neurological: Negative for dizziness, weakness and headaches.  Endo/Heme/Allergies: Does not bruise/bleed easily.  Psychiatric/Behavioral: Positive for memory loss.    Objective:   Vitals:   02/14/20 1507  BP: 118/62  Pulse: 69  Temp: 98.2 F (36.8 C)  TempSrc: Oral  SpO2: 95%  Weight: 154 lb 3.2 oz (69.9 kg)  Height: 5\' 6"  (1.676 m)     Physical Exam Constitutional:      Appearance: Normal appearance.  HENT:     Head: Normocephalic and atraumatic.     Nose: Nose normal.     Mouth/Throat:     Mouth: Mucous membranes are moist.     Pharynx: Oropharynx is clear.  Eyes:     General: No scleral icterus.    Extraocular Movements: Extraocular movements intact.     Conjunctiva/sclera: Conjunctivae normal.     Pupils: Pupils are equal, round, and reactive to light.  Cardiovascular:     Rate and Rhythm: Normal rate and regular rhythm.     Pulses: Normal pulses.     Heart sounds: Normal  heart sounds.  Pulmonary:     Effort: Pulmonary effort is normal.     Breath sounds: Normal breath sounds. No wheezing, rhonchi or rales.  Abdominal:     General: Bowel sounds are normal.     Palpations: Abdomen is soft.  Musculoskeletal:     Cervical back: Neck supple.     Right lower leg: No edema.     Left lower leg: No edema.  Lymphadenopathy:     Cervical: No cervical adenopathy.  Skin:    General: Skin is warm.     Capillary Refill: Capillary refill takes less than 2 seconds.  Neurological:     General: No focal deficit present.     Mental Status: She is alert.     CBC    Component Value Date/Time   WBC 11.4 (H) 08/09/2019 1503   WBC 14.3 (H) 08/05/2017 1455   RBC 4.18 08/09/2019 1503   HGB 12.6 08/09/2019 1503   HGB 13.8 08/06/2016 1447   HCT 39.5 08/09/2019 1503   HCT 41.5 08/06/2016 1447   PLT 180 08/09/2019 1503   PLT 189 08/06/2016 1447   MCV 94.5 08/09/2019 1503   MCV 90.2 08/06/2016 1447   MCH 30.1 08/09/2019 1503   MCHC 31.9 08/09/2019 1503   RDW 13.9 08/09/2019 1503   RDW 14.2 08/06/2016 1447   LYMPHSABS 7.4 (H) 08/09/2019 1503   LYMPHSABS 12.3 (H) 08/06/2016 1447   MONOABS 0.7 08/09/2019 1503   MONOABS 0.7 08/06/2016 1447   EOSABS 0.2 08/09/2019 1503   EOSABS 0.0 08/06/2016 1447   BASOSABS 0.0 08/09/2019 1503   BASOSABS 0.0 08/06/2016 1447   BMP Latest Ref Rng & Units 08/09/2019 08/05/2017 08/06/2016  Glucose 70 - 99 mg/dL 95 98 95  BUN 8 - 23 mg/dL 14 11 13.0  Creatinine 0.44 - 1.00 mg/dL 1.03(H) 0.91 0.9  Sodium 135 - 145 mmol/L 144 138 138  Potassium 3.5 - 5.1 mmol/L 3.3(L) 3.1(L) 3.9  Chloride 98 - 111 mmol/L 100 100 -  CO2 22 - 32 mmol/L 31 29 31(H)  Calcium 8.9 - 10.3 mg/dL 9.0 9.5 9.5    Chest imaging: CXR 2012 Findings: The lungs are clear but hyperaerated consistent with  COPD. No active infiltrate or effusion is seen. Mediastinal  contours are stable. The heart is mildly enlarged. There is mild  thoracolumbar scoliosis  present.  PFT: No flowsheet data found.  Labs: Reviewed as above     Assessment & Plan:   Chronic obstructive pulmonary disease, unspecified COPD type ([place])  Discussion: Xylia Imran is an 86 year old woman, daily smoker with history of dementia, CVA, chronic lymphocytic leukemia, hypertension and COPD who is referred to pulmonary clinic for evaluation of COPD.   She has underlying COPD based on evidence from 2012 with hyperinflation on her chest radiograph. There are no pulmonary function tests on file and there is no need to put her through further testing at this time based on her lack of symptoms and significant underlying dementia.   We will stop her spiriva handihaler as she has difficulty with administration of this medication. We will trial her on Bevespi inhaler 2 puffs twice daily as she reports easier use with the MDI style inhaler.   She is to follow up in 6 months.  Freda Jackson, MD [facility] Office: [phone]    Current Outpatient Medications:  .  aspirin 81 MG tablet, Take 160 mg by mouth daily.  , Disp: , Rfl:  .  busPIRone (BUSPAR) 15 MG tablet, Take 1 tablet by mouth once daily, Disp: 90 tablet, Rfl: 0 .  clopidogrel (PLAVIX) 75 MG tablet, Take 75 mg by mouth daily.  , Disp: , Rfl:  .  donepezil (ARICEPT) 10 MG tablet, Take 1 tablet (10 mg total) by mouth at bedtime., Disp: 90 tablet, Rfl: 1 .  LORazepam (ATIVAN) 0.5 MG tablet, TAKE 1 TABLET BY MOUTH IN THE MORNING, Disp: 90 tablet, Rfl: 0 .  mirtazapine (REMERON) 15 MG  tablet, TAKE 1 TABLET BY MOUTH AT BEDTIME, Disp: 90 tablet, Rfl: 0 .  PROAIR HFA 108 (90 Base) MCG/ACT inhaler, , Disp: , Rfl:  .  rosuvastatin (CRESTOR) 10 MG tablet, Take 10 mg by mouth daily., Disp: , Rfl:  .  SYNTHROID 112 MCG tablet, Take 112 mcg by mouth daily., Disp: , Rfl:  .  triamterene-hydrochlorothiazide (DYAZIDE) 37.5-25 MG per capsule, Take 1 capsule by mouth every morning.  , Disp: , Rfl:  .   Glycopyrrolate-Formoterol (BEVESPI AEROSPHERE) 9-4.8 MCG/ACT AERO, Inhale 2 puffs into the lungs in the morning and at bedtime., Disp: 10.7 g, Rfl: 6

## 2020-03-26 ENCOUNTER — Other Ambulatory Visit: Payer: Self-pay | Admitting: Physician Assistant

## 2020-04-02 ENCOUNTER — Other Ambulatory Visit: Payer: Self-pay | Admitting: Physician Assistant

## 2020-05-27 ENCOUNTER — Other Ambulatory Visit: Payer: Self-pay | Admitting: Physician Assistant

## 2020-06-27 ENCOUNTER — Other Ambulatory Visit: Payer: Self-pay | Admitting: Physician Assistant

## 2020-07-10 DIAGNOSIS — E7800 Pure hypercholesterolemia, unspecified: Secondary | ICD-10-CM | POA: Diagnosis not present

## 2020-07-10 DIAGNOSIS — Z0000 Encounter for general adult medical examination without abnormal findings: Secondary | ICD-10-CM | POA: Diagnosis not present

## 2020-07-13 DIAGNOSIS — E7800 Pure hypercholesterolemia, unspecified: Secondary | ICD-10-CM | POA: Diagnosis not present

## 2020-07-13 DIAGNOSIS — H6121 Impacted cerumen, right ear: Secondary | ICD-10-CM | POA: Diagnosis not present

## 2020-07-13 DIAGNOSIS — E039 Hypothyroidism, unspecified: Secondary | ICD-10-CM | POA: Diagnosis not present

## 2020-07-13 DIAGNOSIS — C9190 Lymphoid leukemia, unspecified not having achieved remission: Secondary | ICD-10-CM | POA: Diagnosis not present

## 2020-07-13 DIAGNOSIS — Z23 Encounter for immunization: Secondary | ICD-10-CM | POA: Diagnosis not present

## 2020-07-13 DIAGNOSIS — E559 Vitamin D deficiency, unspecified: Secondary | ICD-10-CM | POA: Diagnosis not present

## 2020-07-13 DIAGNOSIS — I10 Essential (primary) hypertension: Secondary | ICD-10-CM | POA: Diagnosis not present

## 2020-07-13 DIAGNOSIS — J449 Chronic obstructive pulmonary disease, unspecified: Secondary | ICD-10-CM | POA: Diagnosis not present

## 2020-07-13 DIAGNOSIS — Z0000 Encounter for general adult medical examination without abnormal findings: Secondary | ICD-10-CM | POA: Diagnosis not present

## 2020-07-24 ENCOUNTER — Other Ambulatory Visit: Payer: Self-pay | Admitting: Pulmonary Disease

## 2020-07-26 ENCOUNTER — Other Ambulatory Visit: Payer: Self-pay | Admitting: Pulmonary Disease

## 2020-08-01 ENCOUNTER — Encounter: Payer: Self-pay | Admitting: Physician Assistant

## 2020-08-01 ENCOUNTER — Ambulatory Visit (INDEPENDENT_AMBULATORY_CARE_PROVIDER_SITE_OTHER): Payer: Medicare Other | Admitting: Physician Assistant

## 2020-08-01 ENCOUNTER — Other Ambulatory Visit: Payer: Self-pay

## 2020-08-01 DIAGNOSIS — R413 Other amnesia: Secondary | ICD-10-CM

## 2020-08-01 DIAGNOSIS — G4700 Insomnia, unspecified: Secondary | ICD-10-CM | POA: Diagnosis not present

## 2020-08-01 DIAGNOSIS — F3341 Major depressive disorder, recurrent, in partial remission: Secondary | ICD-10-CM

## 2020-08-01 DIAGNOSIS — F411 Generalized anxiety disorder: Secondary | ICD-10-CM

## 2020-08-01 DIAGNOSIS — C9110 Chronic lymphocytic leukemia of B-cell type not having achieved remission: Secondary | ICD-10-CM

## 2020-08-01 MED ORDER — LORAZEPAM 0.5 MG PO TABS: 0.5000 mg | ORAL_TABLET | Freq: Every morning | ORAL | 1 refills | Status: DC

## 2020-08-01 MED ORDER — DONEPEZIL HCL 10 MG PO TABS: 10.0000 mg | ORAL_TABLET | Freq: Every day | ORAL | 1 refills | Status: DC

## 2020-08-01 MED ORDER — MIRTAZAPINE 15 MG PO TABS: 15.0000 mg | ORAL_TABLET | Freq: Every day | ORAL | 1 refills | Status: DC

## 2020-08-01 NOTE — Progress Notes (Signed)
[facility]  Patient ID: PANG ROBERS,  MRN: [id number]  PCP: Jani Gravel, MD  Date of Evaluation: 08/01/2020 Time spent:30 minutes  Chief Complaint:  Chief Complaint    Anxiety; Insomnia; Follow-up      HISTORY/CURRENT STATUS: HPI For routine med check. Daughter, [name] with her.   Memory is getting worse. Has been on Aricept for at least 3 years, maybe longer. [name] says pt is asking certain things over and over, can't remember what they were just doing, someone's name, which dr is who. Pt states she doesn't know if her memory is bad or not.  States she works crossword puzzles to keep her mind sharp.  Continues to smoke a few cigarettes every day.  She does not plan on quitting.  States that at her age it does not matter anyway.  She is able to enjoy things.  They have activities at the senior living where she is and she enjoys going to certain events.  Energy and motivation are good for her at her age.  She is able to ambulate and take care of ADLs.  Sleeps just fine and feels rested when she gets up.  No suicidal or homicidal thoughts.  Denies dizziness, syncope, seizures, numbness, tingling, tremor, tics, unsteady gait, slurred speech. Denies muscle or joint pain, stiffness, or dystonia.  Individual Medical History/ Review of Systems: Changes? :Yes  seborrheic keratosis, hx skin CA. Will be seeing Derm in the next week or so.  CLL is stable.  Past medications for mental health diagnoses include: Zoloft, Celexa, Effexor, Paxil, Trintellix, Remeron, Ativan, Viibryd, Cymbalta, Wellbutrin, Ativan, BuSpar  Allergies: Codeine and Hydrocodone  Current Medications:  Current Outpatient Medications:  .  aspirin 81 MG tablet, Take 160 mg by mouth daily., Disp: , Rfl:  .  BEVESPI AEROSPHERE 9-4.8 MCG/ACT AERO, INHALE 2 PUFFS BY MOUTH IN THE MORNING AND AT BEDTIME, Disp: 11 g, Rfl: 4 .  busPIRone (BUSPAR) 15 MG tablet, Take 1 tablet by mouth once daily, Disp: 90 tablet,  Rfl: 3 .  Cholecalciferol (VITAMIN D) 50 MCG (2000 UT) CAPS, , Disp: , Rfl:  .  clopidogrel (PLAVIX) 75 MG tablet, Take 75 mg by mouth daily., Disp: , Rfl:  .  PROAIR HFA 108 (90 Base) MCG/ACT inhaler, , Disp: , Rfl:  .  rosuvastatin (CRESTOR) 10 MG tablet, Take 10 mg by mouth daily., Disp: , Rfl:  .  SYNTHROID 112 MCG tablet, Take 112 mcg by mouth daily., Disp: , Rfl:  .  triamterene-hydrochlorothiazide (DYAZIDE) 37.5-25 MG per capsule, Take 1 capsule by mouth every morning., Disp: , Rfl:  .  donepezil (ARICEPT) 10 MG tablet, Take 1 tablet (10 mg total) by mouth at bedtime., Disp: 90 tablet, Rfl: 1 .  LORazepam (ATIVAN) 0.5 MG tablet, Take 1 tablet (0.5 mg total) by mouth every morning., Disp: 90 tablet, Rfl: 1 .  mirtazapine (REMERON) 15 MG tablet, Take 1 tablet (15 mg total) by mouth at bedtime., Disp: 90 tablet, Rfl: 1 Medication Side Effects: none  Family Medical/ Social History: Changes?no  MENTAL HEALTH EXAM:  There were no vitals taken for this visit.There is no height or weight on file to calculate BMI.  General Appearance: Casual and Well Groomed  Eye Contact:  Good  Speech:  Clear and Coherent and Normal Rate  Volume:  Normal  Mood:  Anxious  Affect:  Anxious  Thought Process:  Goal Directed and Descriptions of Associations: Circumstantial  Orientation:  Full (Time, Place, and Person)  Thought  Content: Rumination   Suicidal Thoughts:  No  Homicidal Thoughts:  No  Memory:  WNL  Judgement:  Good  Insight:  Good  Psychomotor Activity:  Normal  Concentration:  Concentration: Good  Recall:  Good  Fund of Knowledge: Good  Language: Good  Assets:  Desire for Improvement  ADL's:  Intact  Cognition: WNL  Prognosis:  Good    DIAGNOSES:    ICD-10-CM   1. Generalized anxiety disorder  F41.1   2. Memory loss  R41.3   3. Recurrent major depressive disorder, in partial remission ([place])  F33.41   4. Insomnia, unspecified type  G47.00   5. CLL (chronic lymphocytic leukemia)  ([facility])  C91.10     Receiving Psychotherapy: No    RECOMMENDATIONS:  PDMP reviewed. I provided 30 minutes of face-to-face time during this encounter, including time spent in records review and charting. We discussed adding Namenda she would continue the Aricept.  We discussed benefits and risks, side effects and patient prefers to hold off on taking that for now.  Feels like she is on too many medications already.  She and her daughter will discuss it further and [name] can call if they want to try it.  Okay to send in over the phone since we have already discussed it. Overall, she's doing well so no changes in psych meds are necessary. Continue Buspar 15 mg, 1 qd. Cont Aricept 10 mg, daily. Continue Ativan 0.5 mg q am.  Continue mirtazapine 15 mg, 1 p.o. nightly. Return in 6 months.  Donnal Moat, PA-C

## 2020-08-07 DIAGNOSIS — D0472 Carcinoma in situ of skin of left lower limb, including hip: Secondary | ICD-10-CM | POA: Diagnosis not present

## 2020-08-07 DIAGNOSIS — L570 Actinic keratosis: Secondary | ICD-10-CM | POA: Diagnosis not present

## 2020-08-07 DIAGNOSIS — D225 Melanocytic nevi of trunk: Secondary | ICD-10-CM | POA: Diagnosis not present

## 2020-08-07 DIAGNOSIS — X32XXXD Exposure to sunlight, subsequent encounter: Secondary | ICD-10-CM | POA: Diagnosis not present

## 2020-08-07 DIAGNOSIS — Z1283 Encounter for screening for malignant neoplasm of skin: Secondary | ICD-10-CM | POA: Diagnosis not present

## 2020-08-07 DIAGNOSIS — D0471 Carcinoma in situ of skin of right lower limb, including hip: Secondary | ICD-10-CM | POA: Diagnosis not present

## 2020-08-07 DIAGNOSIS — C4442 Squamous cell carcinoma of skin of scalp and neck: Secondary | ICD-10-CM | POA: Diagnosis not present

## 2020-08-08 ENCOUNTER — Other Ambulatory Visit: Payer: Self-pay | Admitting: Physician Assistant

## 2020-08-08 ENCOUNTER — Inpatient Hospital Stay: Payer: Medicare Other | Attending: Internal Medicine | Admitting: Internal Medicine

## 2020-08-08 ENCOUNTER — Inpatient Hospital Stay: Payer: Medicare Other

## 2020-08-08 ENCOUNTER — Other Ambulatory Visit: Payer: Self-pay

## 2020-08-08 VITALS — BP 129/58 | HR 72 | Temp 98.6°F | Resp 18 | Ht 66.0 in | Wt 158.9 lb

## 2020-08-08 DIAGNOSIS — I6529 Occlusion and stenosis of unspecified carotid artery: Secondary | ICD-10-CM | POA: Insufficient documentation

## 2020-08-08 DIAGNOSIS — Z8673 Personal history of transient ischemic attack (TIA), and cerebral infarction without residual deficits: Secondary | ICD-10-CM | POA: Insufficient documentation

## 2020-08-08 DIAGNOSIS — Z85828 Personal history of other malignant neoplasm of skin: Secondary | ICD-10-CM | POA: Insufficient documentation

## 2020-08-08 DIAGNOSIS — Z7982 Long term (current) use of aspirin: Secondary | ICD-10-CM | POA: Insufficient documentation

## 2020-08-08 DIAGNOSIS — Z79899 Other long term (current) drug therapy: Secondary | ICD-10-CM | POA: Diagnosis not present

## 2020-08-08 DIAGNOSIS — C9110 Chronic lymphocytic leukemia of B-cell type not having achieved remission: Secondary | ICD-10-CM

## 2020-08-08 DIAGNOSIS — J449 Chronic obstructive pulmonary disease, unspecified: Secondary | ICD-10-CM | POA: Diagnosis not present

## 2020-08-08 DIAGNOSIS — I10 Essential (primary) hypertension: Secondary | ICD-10-CM | POA: Diagnosis not present

## 2020-08-08 LAB — CMP (CANCER CENTER ONLY)
ALT: 20 U/L (ref 0–44)
AST: 29 U/L (ref 15–41)
Albumin: 4.5 g/dL (ref 3.5–5.0)
Alkaline Phosphatase: 53 U/L (ref 38–126)
Anion gap: 13 (ref 5–15)
BUN: 20 mg/dL (ref 8–23)
CO2: 27 mmol/L (ref 22–32)
Calcium: 8.9 mg/dL (ref 8.9–10.3)
Chloride: 102 mmol/L (ref 98–111)
Creatinine: 1.2 mg/dL — ABNORMAL HIGH (ref 0.44–1.00)
GFR, Estimated: 44 mL/min — ABNORMAL LOW (ref >60)
Glucose, Bld: 92 mg/dL (ref 70–99)
Potassium: 3.7 mmol/L (ref 3.5–5.1)
Sodium: 142 mmol/L (ref 135–145)
Total Bilirubin: 0.4 mg/dL (ref 0.3–1.2)
Total Protein: 7.3 g/dL (ref 6.5–8.1)

## 2020-08-08 LAB — CBC WITH DIFFERENTIAL (CANCER CENTER ONLY)
Abs Immature Granulocytes: 0.02 10*3/uL (ref 0.00–0.07)
Basophils Absolute: 0 10*3/uL (ref 0.0–0.1)
Basophils Relative: 0 %
Eosinophils Absolute: 0.1 10*3/uL (ref 0.0–0.5)
Eosinophils Relative: 1 %
HCT: 36.6 % (ref 36.0–46.0)
Hemoglobin: 12 g/dL (ref 12.0–15.0)
Immature Granulocytes: 0 %
Lymphocytes Relative: 63 %
Lymphs Abs: 5.9 10*3/uL — ABNORMAL HIGH (ref 0.7–4.0)
MCH: 30.3 pg (ref 26.0–34.0)
MCHC: 32.8 g/dL (ref 30.0–36.0)
MCV: 92.4 fL (ref 80.0–100.0)
Monocytes Absolute: 0.6 10*3/uL (ref 0.1–1.0)
Monocytes Relative: 7 %
Neutro Abs: 2.7 10*3/uL (ref 1.7–7.7)
Neutrophils Relative %: 29 %
Platelet Count: 184 10*3/uL (ref 150–400)
RBC: 3.96 MIL/uL (ref 3.87–5.11)
RDW: 14.3 % (ref 11.5–15.5)
WBC Count: 9.4 10*3/uL (ref 4.0–10.5)
nRBC: 0 % (ref 0.0–0.2)

## 2020-08-08 LAB — LACTATE DEHYDROGENASE: LDH: 146 U/L (ref 98–192)

## 2020-08-08 NOTE — Progress Notes (Signed)
[facility] [phone]   [phone]  OFFICE PROGRESS NOTE  DIAGNOSIS: Stage 0 chronic lymphocytic leukemia diagnosed in August of 2003   PRIOR THERAPY: None   CURRENT THERAPY: Observation.  INTERVAL HISTORY: Heather Avery 87 y.o. female returns to the clinic today for annual follow-up visit accompanied by her daughter.  The patient is feeling fine today with no concerning complaints except for some signs of dementia.  She denied having any current chest pain, shortness of breath, cough or hemoptysis.  She denied having any palpable lymphadenopathy, bleeding or bruising.  She has no nausea, vomiting, diarrhea or constipation.  She is currently living independently at [facility].  She is here today for evaluation with repeat blood work.  MEDICAL HISTORY: Past Medical History:  Diagnosis Date  . Blind right eye   . Carotid artery occlusion   . COPD (chronic obstructive pulmonary disease) ([place])   . Degeneration of intervertebral disc   . Hearing loss in left ear   . Hypertension   . Leukemia ([place])   . Skin cancer   . Stroke [facility])     ALLERGIES:  is allergic to codeine and hydrocodone.  MEDICATIONS:  Current Outpatient Medications  Medication Sig Dispense Refill  . aspirin 81 MG tablet Take 160 mg by mouth daily.    Marland Kitchen BEVESPI AEROSPHERE 9-4.8 MCG/ACT AERO INHALE 2 PUFFS BY MOUTH IN THE MORNING AND AT BEDTIME 11 g 4  . busPIRone (BUSPAR) 15 MG tablet Take 1 tablet by mouth once daily 90 tablet 3  . Cholecalciferol (VITAMIN D) 50 MCG (2000 UT) CAPS     . clopidogrel (PLAVIX) 75 MG tablet Take 75 mg by mouth daily.    Marland Kitchen donepezil (ARICEPT) 10 MG tablet Take 1 tablet (10 mg total) by mouth at bedtime. 90 tablet 1  . LORazepam (ATIVAN) 0.5 MG tablet Take 1 tablet (0.5 mg total) by mouth every morning. 90 tablet 1  . mirtazapine (REMERON) 15 MG tablet Take 1 tablet (15 mg total) by mouth at bedtime. 90 tablet 1  . PROAIR HFA 108 (90 Base)  MCG/ACT inhaler     . rosuvastatin (CRESTOR) 10 MG tablet Take 10 mg by mouth daily.    Marland Kitchen SYNTHROID 112 MCG tablet Take 112 mcg by mouth daily.    Marland Kitchen triamterene-hydrochlorothiazide (DYAZIDE) 37.5-25 MG per capsule Take 1 capsule by mouth every morning.     No current facility-administered medications for this visit.    REVIEW OF SYSTEMS:  A comprehensive review of systems was negative except for: Neurological: positive for memory problems   PHYSICAL EXAMINATION: General appearance: alert, cooperative and no distress Head: Normocephalic, without obvious abnormality, atraumatic Neck: no adenopathy Lymph nodes: Cervical, supraclavicular, and axillary nodes normal. Resp: clear to auscultation bilaterally Back: symmetric, no curvature. ROM normal. No CVA tenderness. Cardio: regular rate and rhythm, S1, S2 normal, no murmur, click, rub or gallop GI: soft, non-tender; bowel sounds normal; no masses,  no organomegaly Extremities: extremities normal, atraumatic, no cyanosis or edema  ECOG PERFORMANCE STATUS: 0 - Asymptomatic  Blood pressure (!) 129/58, pulse 72, temperature 98.6 F (37 C), temperature source Tympanic, resp. rate 18, height 5\' 6"  (1.676 m), weight 158 lb 14.4 oz (72.1 kg), SpO2 96 %.  LABORATORY DATA: Lab Results  Component Value Date   WBC 9.4 08/08/2020   HGB 12.0 08/08/2020   HCT 36.6 08/08/2020   MCV 92.4 08/08/2020   PLT 184 08/08/2020  Chemistry      Component Value Date/Time   NA 144 08/09/2019 1503   NA 138 08/06/2016 1447   K 3.3 (L) 08/09/2019 1503   K 3.9 08/06/2016 1447   CL 100 08/09/2019 1503   CL 99 09/14/2012 1500   CO2 31 08/09/2019 1503   CO2 31 (H) 08/06/2016 1447   BUN 14 08/09/2019 1503   BUN 13.0 08/06/2016 1447   CREATININE 1.03 (H) 08/09/2019 1503   CREATININE 0.9 08/06/2016 1447      Component Value Date/Time   CALCIUM 9.0 08/09/2019 1503   CALCIUM 9.5 08/06/2016 1447   ALKPHOS 61 08/09/2019 1503   ALKPHOS 66 08/06/2016 1447    AST 23 08/09/2019 1503   AST 27 08/06/2016 1447   ALT 17 08/09/2019 1503   ALT 27 08/06/2016 1447   BILITOT 0.6 08/09/2019 1503   BILITOT 0.75 08/06/2016 1447       RADIOGRAPHIC STUDIES: No results found.  ASSESSMENT AND PLAN:  This is a very pleasant 87 years old white female with chronic lymphocytic leukemia diagnosed in August 2003. The patient has been in observation for the last 20 years and she is feeling fine today with no concerning complaints. Repeat CBC today showed normal total white blood count and no signs of anemia or thrombocytopenia. I recommended for her to continue on observation with repeat blood work in 1 year. She was advised to call immediately if she has any concerning symptoms in the interval.  All questions were answered. The patient knows to call the clinic with any problems, questions or concerns. We can certainly see the patient much sooner if necessary.  Disclaimer: This note was dictated with voice recognition software. Similar sounding words can inadvertently be transcribed and may not be corrected upon review.

## 2020-08-29 ENCOUNTER — Ambulatory Visit: Payer: Medicare Other | Admitting: Pulmonary Disease

## 2020-08-29 ENCOUNTER — Encounter: Payer: Self-pay | Admitting: Pulmonary Disease

## 2020-08-29 ENCOUNTER — Ambulatory Visit (INDEPENDENT_AMBULATORY_CARE_PROVIDER_SITE_OTHER): Payer: Medicare Other

## 2020-08-29 ENCOUNTER — Other Ambulatory Visit: Payer: Self-pay

## 2020-08-29 VITALS — BP 120/70 | HR 65 | Temp 97.0°F | Ht 66.0 in | Wt 159.6 lb

## 2020-08-29 DIAGNOSIS — F17210 Nicotine dependence, cigarettes, uncomplicated: Secondary | ICD-10-CM | POA: Diagnosis not present

## 2020-08-29 DIAGNOSIS — J449 Chronic obstructive pulmonary disease, unspecified: Secondary | ICD-10-CM

## 2020-08-29 DIAGNOSIS — R0602 Shortness of breath: Secondary | ICD-10-CM | POA: Diagnosis not present

## 2020-08-29 MED ORDER — BREZTRI AEROSPHERE 160-9-4.8 MCG/ACT IN AERO: 2.0000 | INHALATION_SPRAY | Freq: Two times a day (BID) | RESPIRATORY_TRACT | 6 refills | Status: DC

## 2020-08-29 NOTE — Progress Notes (Signed)
Synopsis: Referred by Heather Gravel, MD for COPD  Subjective:   PATIENT ID: Heather Avery GENDER: female DOB: 1930-10-02, MRN: [id number]   HPI  Chief Complaint  Patient presents with  . Follow-up    6 mo f/u for COPD. States her breathing has been stable since last visit.    Heather Avery is an 87 year old woman, daily smoker with history of dementia, CVA, chronic lymphocytic leukemia, hypertension and COPD who returns to pulmonary clinic for COPD follow up.   She is accompanied by her daughter, Heather Avery for today's visit. The patient lives alone in a retirement community and is independent in her daily activities. Her daughter checks in on her frequently and helps organize her medications for the week.   The patient is currently using bevespi twice daily and albuterol as needed. There is reported confusion between the inhalers as she can use the bevespi as needed throughout the day instead of albuterol.   She continues to smoke cigarettes daily. She denies issues with her breathing at this time, only dyspnea with exertion.  Past Medical History:  Diagnosis Date  . Blind right eye   . Carotid artery occlusion   . COPD (chronic obstructive pulmonary disease) ([place])   . Degeneration of intervertebral disc   . Hearing loss in left ear   . Hypertension   . Leukemia ([place])   . Skin cancer   . Stroke [facility])      No family history on file.   Social History   Socioeconomic History  . Marital status: Married    Spouse name: Not on file  . Number of children: Not on file  . Years of education: Not on file  . Highest education level: Not on file  Occupational History  . Not on file  Tobacco Use  . Smoking status: Current Every Day Smoker    Packs/day: 0.10    Types: Cigarettes  . Smokeless tobacco: Never Used  Substance and Sexual Activity  . Alcohol use: Not Currently  . Drug use: No  . Sexual activity: Not Currently  Other Topics Concern  . Not on file  Social History  Narrative  . Not on file   Social Determinants of Health   Financial Resource Strain: Not on file  Food Insecurity: Not on file  Transportation Needs: Not on file  Physical Activity: Not on file  Stress: Not on file  Social Connections: Not on file  Intimate Partner Violence: Not on file     Allergies  Allergen Reactions  . Codeine   . Hydrocodone Other (See Comments)    "Feels nervous and anxious".   Patient takes as needed     Outpatient Medications Prior to Visit  Medication Sig Dispense Refill  . aspirin 81 MG tablet Take 160 mg by mouth daily.    . busPIRone (BUSPAR) 15 MG tablet Take 1 tablet by mouth once daily 90 tablet 3  . Cholecalciferol (VITAMIN D) 50 MCG (2000 UT) CAPS     . clopidogrel (PLAVIX) 75 MG tablet Take 75 mg by mouth daily.    Marland Kitchen donepezil (ARICEPT) 10 MG tablet Take 1 tablet (10 mg total) by mouth at bedtime. 90 tablet 1  . LORazepam (ATIVAN) 0.5 MG tablet Take 1 tablet (0.5 mg total) by mouth every morning. 90 tablet 1  . mirtazapine (REMERON) 15 MG tablet Take 1 tablet (15 mg total) by mouth at bedtime. 90 tablet 1  . PROAIR HFA 108 (90 Base) MCG/ACT  inhaler     . rosuvastatin (CRESTOR) 10 MG tablet Take 10 mg by mouth daily.    Marland Kitchen SYNTHROID 112 MCG tablet Take 112 mcg by mouth daily.    Marland Kitchen triamterene-hydrochlorothiazide (DYAZIDE) 37.5-25 MG per capsule Take 1 capsule by mouth every morning.    Marland Kitchen BEVESPI AEROSPHERE 9-4.8 MCG/ACT AERO INHALE 2 PUFFS BY MOUTH IN THE MORNING AND AT BEDTIME 11 g 4   No facility-administered medications prior to visit.    Review of Systems  Constitutional: Negative for chills, diaphoresis, fever, malaise/fatigue and weight loss.  HENT: Negative for congestion, sinus pain and sore throat.   Eyes: Negative.   Respiratory: Positive for shortness of breath. Negative for cough, hemoptysis, sputum production and wheezing.   Cardiovascular: Negative for chest pain, orthopnea, leg swelling and PND.  Gastrointestinal: Negative  for heartburn.  Genitourinary: Negative.   Musculoskeletal: Negative.   Neurological: Negative for dizziness, weakness and headaches.  Endo/Heme/Allergies: Does not bruise/bleed easily.  Psychiatric/Behavioral: Positive for memory loss.    Objective:   Vitals:   08/29/20 1335  BP: 120/70  Pulse: 65  Temp: (!) 97 F (36.1 C)  TempSrc: Temporal  SpO2: 95%  Weight: 159 lb 9.6 oz (72.4 kg)  Height: 5\' 6"  (1.676 m)     Physical Exam Constitutional:      Appearance: Normal appearance.  HENT:     Head: Normocephalic and atraumatic.  Eyes:     General: No scleral icterus.    Extraocular Movements: Extraocular movements intact.     Conjunctiva/sclera: Conjunctivae normal.     Pupils: Pupils are equal, round, and reactive to light.  Cardiovascular:     Rate and Rhythm: Normal rate and regular rhythm.     Pulses: Normal pulses.     Heart sounds: Normal heart sounds.  Pulmonary:     Effort: Pulmonary effort is normal.     Breath sounds: Normal breath sounds. No wheezing, rhonchi or rales.  Musculoskeletal:     Cervical back: Neck supple.     Right lower leg: No edema.     Left lower leg: No edema.  Lymphadenopathy:     Cervical: No cervical adenopathy.  Skin:    General: Skin is warm.  Neurological:     General: No focal deficit present.     Mental Status: She is alert.     CBC    Component Value Date/Time   WBC 9.4 08/08/2020 1507   WBC 14.3 (H) 08/05/2017 1455   RBC 3.96 08/08/2020 1507   HGB 12.0 08/08/2020 1507   HGB 13.8 08/06/2016 1447   HCT 36.6 08/08/2020 1507   HCT 41.5 08/06/2016 1447   PLT 184 08/08/2020 1507   PLT 189 08/06/2016 1447   MCV 92.4 08/08/2020 1507   MCV 90.2 08/06/2016 1447   MCH 30.3 08/08/2020 1507   MCHC 32.8 08/08/2020 1507   RDW 14.3 08/08/2020 1507   RDW 14.2 08/06/2016 1447   LYMPHSABS 5.9 (H) 08/08/2020 1507   LYMPHSABS 12.3 (H) 08/06/2016 1447   MONOABS 0.6 08/08/2020 1507   MONOABS 0.7 08/06/2016 1447   EOSABS 0.1  08/08/2020 1507   EOSABS 0.0 08/06/2016 1447   BASOSABS 0.0 08/08/2020 1507   BASOSABS 0.0 08/06/2016 1447   BMP Latest Ref Rng & Units 08/08/2020 08/09/2019 08/05/2017  Glucose 70 - 99 mg/dL 92 95 98  BUN 8 - 23 mg/dL 20 14 11   Creatinine 0.44 - 1.00 mg/dL 1.20(H) 1.03(H) 0.91  Sodium 135 - 145 mmol/L 142 144  138  Potassium 3.5 - 5.1 mmol/L 3.7 3.3(L) 3.1(L)  Chloride 98 - 111 mmol/L 102 100 100  CO2 22 - 32 mmol/L 27 31 29   Calcium 8.9 - 10.3 mg/dL 8.9 9.0 9.5    Chest imaging: CXR 08/29/20 Hyperinflated lungs, no nodules or masses noted. No pleural effusions  CXR 2012 Findings: The lungs are clear but hyperaerated consistent with  COPD. No active infiltrate or effusion is seen. Mediastinal  contours are stable. The heart is mildly enlarged. There is mild  thoracolumbar scoliosis present.  PFT: No flowsheet data found.  Labs: Reviewed as above     Assessment & Plan:   Chronic obstructive pulmonary disease, unspecified COPD type ([place]) - Plan: DG Chest 2 View, Budeson-Glycopyrrol-Formoterol (BREZTRI AEROSPHERE) 160-9-4.8 MCG/ACT AERO  Cigarette smoker  Discussion: Heather Avery is an 87 year old woman, daily smoker with history of dementia, CVA, chronic lymphocytic leukemia, hypertension and COPD who returns to pulmonary clinic for COPD follow up.   We will transition her breztri inhaler 2 puffs twice daily. She is to stop the bevespi inhaler. She can continue as needed albuterol inhaler.   She is to follow up in 6 months.  Freda Jackson, MD [facility] Office: [phone]    Current Outpatient Medications:  .  aspirin 81 MG tablet, Take 160 mg by mouth daily., Disp: , Rfl:  .  Budeson-Glycopyrrol-Formoterol (BREZTRI AEROSPHERE) 160-9-4.8 MCG/ACT AERO, Inhale 2 puffs into the lungs in the morning and at bedtime., Disp: 10.7 g, Rfl: 6 .  busPIRone (BUSPAR) 15 MG tablet, Take 1 tablet by mouth once daily, Disp: 90 tablet, Rfl: 3 .   Cholecalciferol (VITAMIN D) 50 MCG (2000 UT) CAPS, , Disp: , Rfl:  .  clopidogrel (PLAVIX) 75 MG tablet, Take 75 mg by mouth daily., Disp: , Rfl:  .  donepezil (ARICEPT) 10 MG tablet, Take 1 tablet (10 mg total) by mouth at bedtime., Disp: 90 tablet, Rfl: 1 .  LORazepam (ATIVAN) 0.5 MG tablet, Take 1 tablet (0.5 mg total) by mouth every morning., Disp: 90 tablet, Rfl: 1 .  mirtazapine (REMERON) 15 MG tablet, Take 1 tablet (15 mg total) by mouth at bedtime., Disp: 90 tablet, Rfl: 1 .  PROAIR HFA 108 (90 Base) MCG/ACT inhaler, , Disp: , Rfl:  .  rosuvastatin (CRESTOR) 10 MG tablet, Take 10 mg by mouth daily., Disp: , Rfl:  .  SYNTHROID 112 MCG tablet, Take 112 mcg by mouth daily., Disp: , Rfl:  .  triamterene-hydrochlorothiazide (DYAZIDE) 37.5-25 MG per capsule, Take 1 capsule by mouth every morning., Disp: , Rfl:

## 2020-08-29 NOTE — Patient Instructions (Addendum)
Start breztri inhaler 2 puffs twice daily - rinse your mouth out after each use  Continue to use albuterol as needed 1-2 puffs every 4-6 hours as needed.   We will check a chest radiograph today

## 2020-09-05 DIAGNOSIS — Z85828 Personal history of other malignant neoplasm of skin: Secondary | ICD-10-CM | POA: Diagnosis not present

## 2020-09-05 DIAGNOSIS — X32XXXD Exposure to sunlight, subsequent encounter: Secondary | ICD-10-CM | POA: Diagnosis not present

## 2020-09-05 DIAGNOSIS — L570 Actinic keratosis: Secondary | ICD-10-CM | POA: Diagnosis not present

## 2020-09-05 DIAGNOSIS — Z08 Encounter for follow-up examination after completed treatment for malignant neoplasm: Secondary | ICD-10-CM | POA: Diagnosis not present

## 2020-09-06 ENCOUNTER — Telehealth: Payer: Self-pay | Admitting: Pulmonary Disease

## 2020-09-06 NOTE — Telephone Encounter (Signed)
PA has been initiated via CCM.   I have called and spoke with [name] and she is aware that it can take up to 24-72 hours for a response.   She voiced her understanding.

## 2020-09-12 NOTE — Telephone Encounter (Signed)
Leigh, please advise if you have the key or update on this. Thanks.

## 2020-09-12 NOTE — Telephone Encounter (Signed)
This medication or product is on your plan's list of covered drugs. Prior authorization is not required at this time. If your pharmacy has questions regarding the processing of your prescription, please have them call the [facility] pharmacy help desk at [phone]. **Please note: This request was submitted electronically. Formulary lowering, tiering exception, cost reduction and/or pre-benefit determination review (including prospective Medicare hospice reviews) requests cannot be requested using this method of submission. Providers contact us at [phone] for further assistance.   I have called and LM on VM for Rhonda to call us back to make her aware of the above.

## 2020-09-13 NOTE — Telephone Encounter (Signed)
Called and spoke to Heather Avery. Informed her of the information per Leigh. [name] states she picked up pt's Breztri yesterday from the pharmacy without issues. Nothing further needed at this time.

## 2020-10-03 DIAGNOSIS — X32XXXD Exposure to sunlight, subsequent encounter: Secondary | ICD-10-CM | POA: Diagnosis not present

## 2020-10-03 DIAGNOSIS — L570 Actinic keratosis: Secondary | ICD-10-CM | POA: Diagnosis not present

## 2021-01-11 DIAGNOSIS — E559 Vitamin D deficiency, unspecified: Secondary | ICD-10-CM | POA: Diagnosis not present

## 2021-01-11 DIAGNOSIS — E7800 Pure hypercholesterolemia, unspecified: Secondary | ICD-10-CM | POA: Diagnosis not present

## 2021-01-24 DIAGNOSIS — I639 Cerebral infarction, unspecified: Secondary | ICD-10-CM | POA: Diagnosis not present

## 2021-01-24 DIAGNOSIS — E039 Hypothyroidism, unspecified: Secondary | ICD-10-CM | POA: Diagnosis not present

## 2021-01-24 DIAGNOSIS — Z23 Encounter for immunization: Secondary | ICD-10-CM | POA: Diagnosis not present

## 2021-01-24 DIAGNOSIS — E559 Vitamin D deficiency, unspecified: Secondary | ICD-10-CM | POA: Diagnosis not present

## 2021-01-24 DIAGNOSIS — I10 Essential (primary) hypertension: Secondary | ICD-10-CM | POA: Diagnosis not present

## 2021-01-24 DIAGNOSIS — E7800 Pure hypercholesterolemia, unspecified: Secondary | ICD-10-CM | POA: Diagnosis not present

## 2021-01-24 DIAGNOSIS — C9190 Lymphoid leukemia, unspecified not having achieved remission: Secondary | ICD-10-CM | POA: Diagnosis not present

## 2021-02-01 ENCOUNTER — Ambulatory Visit: Payer: Medicare Other | Admitting: Physician Assistant

## 2021-02-05 ENCOUNTER — Other Ambulatory Visit: Payer: Self-pay | Admitting: Physician Assistant

## 2021-02-05 NOTE — Telephone Encounter (Signed)
Last filled 11/05/20

## 2021-02-26 ENCOUNTER — Other Ambulatory Visit: Payer: Self-pay

## 2021-02-26 ENCOUNTER — Encounter: Payer: Self-pay | Admitting: Physician Assistant

## 2021-02-26 ENCOUNTER — Ambulatory Visit (INDEPENDENT_AMBULATORY_CARE_PROVIDER_SITE_OTHER): Payer: Medicare Other | Admitting: Physician Assistant

## 2021-02-26 DIAGNOSIS — G4700 Insomnia, unspecified: Secondary | ICD-10-CM | POA: Diagnosis not present

## 2021-02-26 DIAGNOSIS — F411 Generalized anxiety disorder: Secondary | ICD-10-CM

## 2021-02-26 DIAGNOSIS — R413 Other amnesia: Secondary | ICD-10-CM

## 2021-02-26 DIAGNOSIS — F3341 Major depressive disorder, recurrent, in partial remission: Secondary | ICD-10-CM

## 2021-02-26 MED ORDER — BUSPIRONE HCL 15 MG PO TABS: 15.0000 mg | ORAL_TABLET | Freq: Every day | ORAL | 3 refills | Status: DC

## 2021-02-26 MED ORDER — MIRTAZAPINE 15 MG PO TABS: 15.0000 mg | ORAL_TABLET | Freq: Every day | ORAL | 3 refills | Status: DC

## 2021-02-26 MED ORDER — DONEPEZIL HCL 10 MG PO TABS: 10.0000 mg | ORAL_TABLET | Freq: Every day | ORAL | 3 refills | Status: DC

## 2021-02-26 MED ORDER — LORAZEPAM 0.5 MG PO TABS: 0.5000 mg | ORAL_TABLET | Freq: Every morning | ORAL | 1 refills | Status: DC

## 2021-02-26 NOTE — Progress Notes (Addendum)
[facility] Med Check  Patient ID: Heather Avery,  MRN: [id number]  PCP: Jani Gravel, MD  Date of Evaluation: 02/26/2021 Time spent:20 minutes  Chief Complaint:  Chief Complaint   Anxiety; Depression; Insomnia; Follow-up     HISTORY/CURRENT STATUS: HPI For routine med check. Daughter, [name] with her.   [name] states her memory is getting worse.  It is immediate or recent memory deficits, if she is prompted enough, she can remember things from long ago.  Patient says her mother had dementia and she might be getting that way.  Has been on Aricept for at least 3 years, probably longer.  [name] states that her grandmother was much worse by this age so overall Ms. Helminiak memory is age appropriate.    Continues to smoke a few cigarettes every day, maybe 5.  She does not plan on quitting.    She is able to enjoy things. She does crossword puzzles and watches game shows for fun.  She does not do things at the senior center very often, her meals are delivered to her room.  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  Energy and motivation are good for her at her age.  She is able to ambulate and take care of ADLs.  Sleeps just fine and feels rested when she gets up.  No suicidal or homicidal thoughts.  Does have anxiety, more generalized than panic attacks.  She takes the Ativan every morning and the BuSpar daily as directed and says she feels fine like it is.  Denies dizziness, syncope, seizures, numbness, tingling, tremor, tics, unsteady gait, slurred speech. Denies muscle or joint pain, stiffness, or dystonia.  Individual Medical History/ Review of Systems: Changes? :No    Past medications for mental health diagnoses include: Zoloft, Celexa, Effexor, Paxil, Trintellix, Remeron, Ativan, Viibryd, Cymbalta, Wellbutrin, Ativan, BuSpar  Allergies: Codeine and Hydrocodone  Current Medications:  Current Outpatient Medications:    aspirin 81 MG tablet, Take 160 mg by mouth daily.,  Disp: , Rfl:    Budeson-Glycopyrrol-Formoterol (BREZTRI AEROSPHERE) 160-9-4.8 MCG/ACT AERO, Inhale 2 puffs into the lungs in the morning and at bedtime., Disp: 10.7 g, Rfl: 6   Cholecalciferol (VITAMIN D) 50 MCG (2000 UT) CAPS, , Disp: , Rfl:    clopidogrel (PLAVIX) 75 MG tablet, Take 75 mg by mouth daily., Disp: , Rfl:    PROAIR HFA 108 (90 Base) MCG/ACT inhaler, , Disp: , Rfl:    rosuvastatin (CRESTOR) 10 MG tablet, Take 10 mg by mouth daily., Disp: , Rfl:    SYNTHROID 112 MCG tablet, Take 112 mcg by mouth daily., Disp: , Rfl:    triamterene-hydrochlorothiazide (DYAZIDE) 37.5-25 MG per capsule, Take 1 capsule by mouth every morning., Disp: , Rfl:    busPIRone (BUSPAR) 15 MG tablet, Take 1 tablet (15 mg total) by mouth daily., Disp: 90 tablet, Rfl: 3   donepezil (ARICEPT) 10 MG tablet, Take 1 tablet (10 mg total) by mouth at bedtime., Disp: 90 tablet, Rfl: 3   LORazepam (ATIVAN) 0.5 MG tablet, Take 1 tablet (0.5 mg total) by mouth every morning., Disp: 90 tablet, Rfl: 1   mirtazapine (REMERON) 15 MG tablet, Take 1 tablet (15 mg total) by mouth at bedtime., Disp: 90 tablet, Rfl: 3 Medication Side Effects: none  Family Medical/ Social History: Changes?no  MENTAL HEALTH EXAM:  There were no vitals taken for this visit.There is no height or weight on file to calculate BMI.  General Appearance: Casual and Well Groomed  Eye  Contact:  Good  Speech:  Clear and Coherent and Normal Rate  Volume:  Normal  Mood:  Euthymic  Affect:  Appropriate  Thought Process:  Goal Directed and Descriptions of Associations: Circumstantial  Orientation:  Full (Time, Place, and Person)  Thought Content: Rumination   Suicidal Thoughts:  No  Homicidal Thoughts:  No  Memory:  Immediate;   Poor Recent;   Poor Remote;   Fair  Judgement:  Good  Insight:  Good  Psychomotor Activity:  Normal  Concentration:  Concentration: Good  Recall:  Good  Fund of Knowledge: Good  Language: Good  Assets:  Desire for  Improvement  ADL's:  Intact  Cognition: WNL  Prognosis:  Good    DIAGNOSES:    ICD-10-CM   1. Generalized anxiety disorder  F41.1     2. Memory loss  R41.3     3. Recurrent major depressive disorder, in partial remission ([place])  F33.41     4. Insomnia, unspecified type  G47.00        Receiving Psychotherapy: No    RECOMMENDATIONS:  PDMP reviewed. Last Ativan filled 12/05/2020 I provided 20 minutes of face to face time during this encounter, including time spent before and after the visit in records review, medical decision making, and charting.  Continue Buspar 15 mg, 1 qd. Cont Aricept 10 mg, daily. Continue Ativan 0.5 mg q am.  Continue mirtazapine 15 mg, 1 p.o. nightly. Return in 6 months.  Donnal Moat, PA-C

## 2021-02-27 DIAGNOSIS — L570 Actinic keratosis: Secondary | ICD-10-CM | POA: Diagnosis not present

## 2021-02-27 DIAGNOSIS — X32XXXD Exposure to sunlight, subsequent encounter: Secondary | ICD-10-CM | POA: Diagnosis not present

## 2021-02-27 DIAGNOSIS — S90511A Abrasion, right ankle, initial encounter: Secondary | ICD-10-CM | POA: Diagnosis not present

## 2021-02-28 ENCOUNTER — Other Ambulatory Visit: Payer: Self-pay | Admitting: Physician Assistant

## 2021-03-07 ENCOUNTER — Other Ambulatory Visit: Payer: Self-pay | Admitting: Pulmonary Disease

## 2021-03-07 DIAGNOSIS — J449 Chronic obstructive pulmonary disease, unspecified: Secondary | ICD-10-CM

## 2021-03-13 DIAGNOSIS — D0472 Carcinoma in situ of skin of left lower limb, including hip: Secondary | ICD-10-CM | POA: Diagnosis not present

## 2021-03-13 DIAGNOSIS — D0471 Carcinoma in situ of skin of right lower limb, including hip: Secondary | ICD-10-CM | POA: Diagnosis not present

## 2021-08-07 ENCOUNTER — Inpatient Hospital Stay: Payer: Medicare Other | Admitting: Internal Medicine

## 2021-08-07 ENCOUNTER — Other Ambulatory Visit: Payer: Self-pay

## 2021-08-07 ENCOUNTER — Inpatient Hospital Stay: Payer: Medicare Other | Attending: Internal Medicine

## 2021-08-07 VITALS — BP 148/71 | HR 69 | Temp 97.9°F | Resp 17 | Wt 164.3 lb

## 2021-08-07 DIAGNOSIS — C9111 Chronic lymphocytic leukemia of B-cell type in remission: Secondary | ICD-10-CM | POA: Diagnosis present

## 2021-08-07 DIAGNOSIS — Z7982 Long term (current) use of aspirin: Secondary | ICD-10-CM | POA: Insufficient documentation

## 2021-08-07 DIAGNOSIS — C9110 Chronic lymphocytic leukemia of B-cell type not having achieved remission: Secondary | ICD-10-CM

## 2021-08-07 DIAGNOSIS — Z79899 Other long term (current) drug therapy: Secondary | ICD-10-CM | POA: Insufficient documentation

## 2021-08-07 DIAGNOSIS — J449 Chronic obstructive pulmonary disease, unspecified: Secondary | ICD-10-CM | POA: Insufficient documentation

## 2021-08-07 LAB — CBC WITH DIFFERENTIAL (CANCER CENTER ONLY)
Abs Immature Granulocytes: 0.02 10*3/uL (ref 0.00–0.07)
Basophils Absolute: 0 10*3/uL (ref 0.0–0.1)
Basophils Relative: 0 %
Eosinophils Absolute: 0.1 10*3/uL (ref 0.0–0.5)
Eosinophils Relative: 1 %
HCT: 38.4 % (ref 36.0–46.0)
Hemoglobin: 12.8 g/dL (ref 12.0–15.0)
Immature Granulocytes: 0 %
Lymphocytes Relative: 60 %
Lymphs Abs: 6.1 10*3/uL — ABNORMAL HIGH (ref 0.7–4.0)
MCH: 31.2 pg (ref 26.0–34.0)
MCHC: 33.3 g/dL (ref 30.0–36.0)
MCV: 93.7 fL (ref 80.0–100.0)
Monocytes Absolute: 0.7 10*3/uL (ref 0.1–1.0)
Monocytes Relative: 7 %
Neutro Abs: 3.4 10*3/uL (ref 1.7–7.7)
Neutrophils Relative %: 32 %
Platelet Count: 179 10*3/uL (ref 150–400)
RBC: 4.1 MIL/uL (ref 3.87–5.11)
RDW: 14.4 % (ref 11.5–15.5)
Smear Review: NORMAL
WBC Count: 10.3 10*3/uL (ref 4.0–10.5)
nRBC: 0 % (ref 0.0–0.2)

## 2021-08-07 NOTE — Progress Notes (Signed)
?    [place] ?[phone]   [phone] ? ?OFFICE PROGRESS NOTE ? ?DIAGNOSIS: Stage 0 chronic lymphocytic leukemia diagnosed in August of 2003  ? ?PRIOR THERAPY: None  ? ?CURRENT THERAPY: Observation. ? ?INTERVAL HISTORY: ?Heather Avery 88 y.o. female returns to the clinic today for annual follow-up visit accompanied by her daughter.  The patient is feeling fine today with no concerning complaints except for the hearing deficit.  She has no chest pain, shortness of breath, cough or hemoptysis.  She has no nausea, vomiting, diarrhea or constipation.  She has no headache or visual changes.  She has no palpable lymphadenopathy.  She is here today for evaluation and repeat blood work. ? ?MEDICAL HISTORY: ?Past Medical History:  ?Diagnosis Date  ? Blind right eye   ? Carotid artery occlusion   ? COPD (chronic obstructive pulmonary disease) ([place])   ? Degeneration of intervertebral disc   ? Hearing loss in left ear   ? Hypertension   ? Leukemia ([place])   ? Skin cancer   ? Stroke [facility])   ? ? ?ALLERGIES:  is allergic to codeine and hydrocodone. ? ?MEDICATIONS:  ?Current Outpatient Medications  ?Medication Sig Dispense Refill  ? aspirin 81 MG tablet Take 160 mg by mouth daily.    ? BREZTRI AEROSPHERE 160-9-4.8 MCG/ACT AERO INHALE 2 PUFFS IN THE MORNING AND AT BEDTIME 10.7 g 5  ? busPIRone (BUSPAR) 15 MG tablet Take 1 tablet (15 mg total) by mouth daily. 90 tablet 3  ? Cholecalciferol (VITAMIN D) 50 MCG (2000 UT) CAPS     ? clopidogrel (PLAVIX) 75 MG tablet Take 75 mg by mouth daily.    ? donepezil (ARICEPT) 10 MG tablet Take 1 tablet (10 mg total) by mouth at bedtime. 90 tablet 3  ? LORazepam (ATIVAN) 0.5 MG tablet Take 1 tablet (0.5 mg total) by mouth every morning. 90 tablet 1  ? mirtazapine (REMERON) 15 MG tablet Take 1 tablet (15 mg total) by mouth at bedtime. 90 tablet 3  ? PROAIR HFA 108 (90 Base) MCG/ACT inhaler     ? rosuvastatin (CRESTOR) 10 MG tablet Take 10 mg by mouth daily.     ? SYNTHROID 112 MCG tablet Take 112 mcg by mouth daily.    ? triamterene-hydrochlorothiazide (DYAZIDE) 37.5-25 MG per capsule Take 1 capsule by mouth every morning.    ? ?No current facility-administered medications for this visit.  ? ? ?REVIEW OF SYSTEMS:  A comprehensive review of systems was negative except for: Ears, nose, mouth, throat, and face: positive for hearing loss ?Neurological: positive for memory problems  ? ?PHYSICAL EXAMINATION: General appearance: alert, cooperative, and no distress ?Head: Normocephalic, without obvious abnormality, atraumatic ?Neck: no adenopathy ?Lymph nodes: Cervical, supraclavicular, and axillary nodes normal. ?Resp: clear to auscultation bilaterally ?Back: symmetric, no curvature. ROM normal. No CVA tenderness. ?Cardio: regular rate and rhythm, S1, S2 normal, no murmur, click, rub or gallop ?GI: soft, non-tender; bowel sounds normal; no masses,  no organomegaly ?Extremities: extremities normal, atraumatic, no cyanosis or edema ? ?ECOG PERFORMANCE STATUS: 0 - Asymptomatic ? ?Blood pressure (!) 148/71, pulse 69, temperature 97.9 ?F (36.6 ?C), temperature source Tympanic, resp. rate 17, weight 164 lb 5 oz (74.5 kg), SpO2 96 %. ? ?LABORATORY DATA: ?Lab Results  ?Component Value Date  ? WBC 10.3 08/07/2021  ? HGB 12.8 08/07/2021  ? HCT 38.4 08/07/2021  ? MCV 93.7 08/07/2021  ? PLT 179 08/07/2021  ? ? ?  Chemistry   ?   ?  Component Value Date/Time  ? NA 142 08/08/2020 1507  ? NA 138 08/06/2016 1447  ? K 3.7 08/08/2020 1507  ? K 3.9 08/06/2016 1447  ? CL 102 08/08/2020 1507  ? CL 99 09/14/2012 1500  ? CO2 27 08/08/2020 1507  ? CO2 31 (H) 08/06/2016 1447  ? BUN 20 08/08/2020 1507  ? BUN 13.0 08/06/2016 1447  ? CREATININE 1.20 (H) 08/08/2020 1507  ? CREATININE 0.9 08/06/2016 1447  ?    ?Component Value Date/Time  ? CALCIUM 8.9 08/08/2020 1507  ? CALCIUM 9.5 08/06/2016 1447  ? ALKPHOS 53 08/08/2020 1507  ? ALKPHOS 66 08/06/2016 1447  ? AST 29 08/08/2020 1507  ? AST 27 08/06/2016 1447  ?  ALT 20 08/08/2020 1507  ? ALT 27 08/06/2016 1447  ? BILITOT 0.4 08/08/2020 1507  ? BILITOT 0.75 08/06/2016 1447  ?  ? ? ? ?RADIOGRAPHIC STUDIES: ?No results found. ? ?ASSESSMENT AND PLAN: This is a very pleasant 88 years old white female with stage 0 chronic lymphocytic leukemia diagnosed in August 2003 and has been on observation since that time with no concerning complaints. ?Repeat CBC today showed no concerning findings and it has been normal for the last 2 years. ?I recommended for the patient to continue on observation with routine follow-up visit by her primary care physician from now on.  I will see her on as-needed basis in the future if there is any concerning findings in her blood work. ?The patient was advised to call immediately if she has any concerning complaints. ?All questions were answered. The patient knows to call the clinic with any problems, questions or concerns. We can certainly see the patient much sooner if necessary. ? ?Disclaimer: This note was dictated with voice recognition software. Similar sounding words can inadvertently be transcribed and may not be corrected upon review. ? ?  ?  ?

## 2021-08-20 ENCOUNTER — Encounter: Payer: Self-pay | Admitting: Pulmonary Disease

## 2021-08-20 ENCOUNTER — Ambulatory Visit: Payer: Medicare Other | Admitting: Pulmonary Disease

## 2021-08-20 VITALS — BP 116/64 | HR 71 | Ht 66.0 in | Wt 165.6 lb

## 2021-08-20 DIAGNOSIS — F17210 Nicotine dependence, cigarettes, uncomplicated: Secondary | ICD-10-CM

## 2021-08-20 DIAGNOSIS — J449 Chronic obstructive pulmonary disease, unspecified: Secondary | ICD-10-CM | POA: Diagnosis not present

## 2021-08-20 NOTE — Progress Notes (Signed)
? ?Synopsis: Referred by Jani Gravel, MD for COPD ? ?Subjective:  ? ?PATIENT ID: Heather Avery GENDER: female DOB: 08/20/1930, MRN: [id number] ? ?HPI ? ?Chief Complaint  ?Patient presents with  ? Follow-up  ?  88yrf/u for COPD. Increased wheezing and SOB since the last visit.   ? ?Heather Avery is an 88year old woman, daily smoker with history of dementia, CVA, chronic lymphocytic leukemia, hypertension and COPD who returns to pulmonary clinic for COPD follow up.  ? ?She has been doing well since last visit. She has progressive dyspnea per the daughter. She continues to smoke 4-5 cigs per day. She is using breztri 2 puffs twice daily and albuterol inhaler as needed. No exacerbations of her COPD since last visit. ? ?OV 08/29/20 ?She is accompanied by her daughter, RNanetta Battyfor today's visit. The patient lives alone in a retirement community and is independent in her daily activities. Her daughter checks in on her frequently and helps organize her medications for the week.  ? ?The patient is currently using bevespi twice daily and albuterol as needed. There is reported confusion between the inhalers as she can use the bevespi as needed throughout the day instead of albuterol.  ? ?She continues to smoke cigarettes daily. She denies issues with her breathing at this time, only dyspnea with exertion. ? ?Past Medical History:  ?Diagnosis Date  ? Blind right eye   ? Carotid artery occlusion   ? [place]   ? Degeneration of intervertebral disc   ? Hearing loss in left ear   ? Hypertension   ? [place]   ? Skin cancer   ? Stroke ([facility]   ?  ? ?No family history on file.  ? ?Social History  ? ?Socioeconomic History  ? Marital status: Married  ?  Spouse name: Not on file  ? Number of children: Not on file  ? Years of education: Not on file  ? Highest education level: Not on file  ?Occupational History  ? Not on file  ?Tobacco Use  ? Smoking status: Every Day  ?  Packs/day: 0.20  ?   Types: Cigarettes  ? Smokeless tobacco: Never  ?Substance and Sexual Activity  ? Alcohol use: Not Currently  ? Drug use: No  ? Sexual activity: Not Currently  ?Other Topics Concern  ? Not on file  ?Social History Narrative  ? Not on file  ? ?Social Determinants of Health  ? ?Financial Resource Strain: Not on file  ?Food Insecurity: Not on file  ?Transportation Needs: Not on file  ?Physical Activity: Not on file  ?Stress: Not on file  ?Social Connections: Not on file  ?Intimate Partner Violence: Not on file  ?  ? ?Allergies  ?Allergen Reactions  ? Codeine   ? Hydrocodone Other (See Comments)  ?  "Feels nervous and anxious".   Patient takes as needed  ?  ? ?Outpatient Medications Prior to Visit  ?Medication Sig Dispense Refill  ? aspirin 81 MG tablet Take 160 mg by mouth daily.    ? BREZTRI AEROSPHERE 160-9-4.8 MCG/ACT AERO INHALE 2 PUFFS IN THE MORNING AND AT BEDTIME 10.7 g 5  ? busPIRone (BUSPAR) 15 MG tablet Take 1 tablet (15 mg total) by mouth daily. 90 tablet 3  ? Cholecalciferol (VITAMIN D) 50 MCG (2000 UT) CAPS     ? clopidogrel (PLAVIX) 75 MG tablet Take 75 mg by mouth daily.    ? donepezil (ARICEPT) 10 MG  tablet Take 1 tablet (10 mg total) by mouth at bedtime. 90 tablet 3  ? LORazepam (ATIVAN) 0.5 MG tablet Take 1 tablet (0.5 mg total) by mouth every morning. 90 tablet 1  ? mirtazapine (REMERON) 15 MG tablet Take 1 tablet (15 mg total) by mouth at bedtime. 90 tablet 3  ? PROAIR HFA 108 (90 Base) MCG/ACT inhaler     ? rosuvastatin (CRESTOR) 10 MG tablet Take 10 mg by mouth daily.    ? SYNTHROID 112 MCG tablet Take 112 mcg by mouth daily.    ? triamterene-hydrochlorothiazide (DYAZIDE) 37.5-25 MG per capsule Take 1 capsule by mouth every morning.    ? ?No facility-administered medications prior to visit.  ? ? ?Review of Systems  ?Constitutional:  Negative for chills, diaphoresis, fever, malaise/fatigue and weight loss.  ?HENT:  Negative for congestion, sinus pain and sore throat.   ?Eyes: Negative.    ?Respiratory:  Positive for shortness of breath. Negative for cough, hemoptysis, sputum production and wheezing.   ?Cardiovascular:  Negative for chest pain, orthopnea, leg swelling and PND.  ?Gastrointestinal:  Negative for heartburn.  ?Genitourinary: Negative.   ?Musculoskeletal: Negative.   ?Neurological:  Negative for dizziness, weakness and headaches.  ?Endo/Heme/Allergies:  Does not bruise/bleed easily.  ?Psychiatric/Behavioral:  Positive for memory loss.   ? ?Objective:  ? ?Vitals:  ? 08/20/21 1438  ?BP: 116/64  ?Pulse: 71  ?SpO2: 96%  ?Weight: 165 lb 9.6 oz (75.1 kg)  ?Height: '5\' 6"'$  (1.676 m)  ? ?Physical Exam ?Constitutional:   ?   Appearance: Normal appearance.  ?HENT:  ?   Head: Normocephalic and atraumatic.  ?Eyes:  ?   General: No scleral icterus. ?   Conjunctiva/sclera: Conjunctivae normal.  ?Cardiovascular:  ?   Rate and Rhythm: Normal rate and regular rhythm.  ?   Pulses: Normal pulses.  ?   Heart sounds: Normal heart sounds.  ?Pulmonary:  ?   Effort: Pulmonary effort is normal.  ?   Breath sounds: Normal breath sounds. No wheezing, rhonchi or rales.  ?Musculoskeletal:  ?   Cervical back: Neck supple.  ?   Right lower leg: No edema.  ?   Left lower leg: No edema.  ?Lymphadenopathy:  ?   Cervical: No cervical adenopathy.  ?Skin: ?   General: Skin is warm.  ?Neurological:  ?   General: No focal deficit present.  ?   Mental Status: She is alert.  ? ? ?CBC ?   ?Component Value Date/Time  ? WBC 10.3 08/07/2021 1501  ? WBC 14.3 (H) 08/05/2017 1455  ? RBC 4.10 08/07/2021 1501  ? HGB 12.8 08/07/2021 1501  ? HGB 13.8 08/06/2016 1447  ? HCT 38.4 08/07/2021 1501  ? HCT 41.5 08/06/2016 1447  ? PLT 179 08/07/2021 1501  ? PLT 189 08/06/2016 1447  ? MCV 93.7 08/07/2021 1501  ? MCV 90.2 08/06/2016 1447  ? MCH 31.2 08/07/2021 1501  ? MCHC 33.3 08/07/2021 1501  ? RDW 14.4 08/07/2021 1501  ? RDW 14.2 08/06/2016 1447  ? LYMPHSABS 6.1 (H) 08/07/2021 1501  ? LYMPHSABS 12.3 (H) 08/06/2016 1447  ? MONOABS 0.7 08/07/2021  1501  ? MONOABS 0.7 08/06/2016 1447  ? EOSABS 0.1 08/07/2021 1501  ? EOSABS 0.0 08/06/2016 1447  ? BASOSABS 0.0 08/07/2021 1501  ? BASOSABS 0.0 08/06/2016 1447  ? ? ?  Latest Ref Rng & Units 08/08/2020  ?  3:07 PM 08/09/2019  ?  3:03 PM 08/05/2017  ?  2:55 PM  ?BMP  ?Glucose  70 - 99 mg/dL 92   95   98    ?BUN 8 - 23 mg/dL '20   14   11    '$ ?Creatinine 0.44 - 1.00 mg/dL 1.20   1.03   0.91    ?Sodium 135 - 145 mmol/L 142   144   138    ?Potassium 3.5 - 5.1 mmol/L 3.7   3.3   3.1    ?Chloride 98 - 111 mmol/L 102   100   100    ?CO2 22 - 32 mmol/L '27   31   29    '$ ?Calcium 8.9 - 10.3 mg/dL 8.9   9.0   9.5    ? ? ?Chest imaging: ?CXR 08/29/20 ?Hyperinflated lungs, no nodules or masses noted. No pleural effusions ? ?CXR 2012 ?Findings: The lungs are clear but hyperaerated consistent with  ?COPD.  No active infiltrate or effusion is seen.  Mediastinal  ?contours are stable.  The heart is mildly enlarged.  There is mild  ?thoracolumbar scoliosis present. ? ?PFT: ?   ? View : No data to display.  ?  ?  ?  ? ? ?Labs: ?Reviewed as above ? ?   ?Assessment & Plan:  ? ?Chronic obstructive pulmonary disease, unspecified COPD type ([place]) ? ?Cigarette smoker ? ?Discussion: ?Rhaya Coale is an 88 year old woman, daily smoker with history of dementia, CVA, chronic lymphocytic leukemia, hypertension and COPD who returns to pulmonary clinic for COPD follow up.  ? ?She is to continue breztri inhaler 2 puffs twice daily. She can continue as needed albuterol inhaler.  ? ?Follow up in 1 year. ? ?Heather Jackson, MD ?[facility] ?Office: [phone] ? ? ?Current Outpatient Medications:  ?  aspirin 81 MG tablet, Take 160 mg by mouth daily., Disp: , Rfl:  ?  BREZTRI AEROSPHERE 160-9-4.8 MCG/ACT AERO, INHALE 2 PUFFS IN THE MORNING AND AT BEDTIME, Disp: 10.7 g, Rfl: 5 ?  busPIRone (BUSPAR) 15 MG tablet, Take 1 tablet (15 mg total) by mouth daily., Disp: 90 tablet, Rfl: 3 ?  Cholecalciferol (VITAMIN D) 50 MCG (2000 UT) CAPS, , Disp:  , Rfl:  ?  clopidogrel (PLAVIX) 75 MG tablet, Take 75 mg by mouth daily., Disp: , Rfl:  ?  donepezil (ARICEPT) 10 MG tablet, Take 1 tablet (10 mg total) by mouth at bedtime., Disp: 90 tablet, Rfl: 3 ?  LORazepam (

## 2021-08-20 NOTE — Patient Instructions (Signed)
Continue breztri inhaler 2 puffs twice daily ? ?Continue albuterol inhaler 1-2 puffs every 4-6 hours as needed ? ?Follow up in 1 year ?

## 2021-08-21 ENCOUNTER — Other Ambulatory Visit: Payer: Self-pay | Admitting: Pulmonary Disease

## 2021-08-21 DIAGNOSIS — J449 Chronic obstructive pulmonary disease, unspecified: Secondary | ICD-10-CM

## 2021-08-28 ENCOUNTER — Encounter: Payer: Self-pay | Admitting: Physician Assistant

## 2021-08-28 ENCOUNTER — Ambulatory Visit: Payer: Medicare Other | Admitting: Physician Assistant

## 2021-08-28 DIAGNOSIS — G4700 Insomnia, unspecified: Secondary | ICD-10-CM

## 2021-08-28 DIAGNOSIS — F17200 Nicotine dependence, unspecified, uncomplicated: Secondary | ICD-10-CM | POA: Diagnosis not present

## 2021-08-28 DIAGNOSIS — F3341 Major depressive disorder, recurrent, in partial remission: Secondary | ICD-10-CM

## 2021-08-28 DIAGNOSIS — F411 Generalized anxiety disorder: Secondary | ICD-10-CM | POA: Diagnosis not present

## 2021-08-28 MED ORDER — LORAZEPAM 0.5 MG PO TABS: 0.5000 mg | ORAL_TABLET | Freq: Every morning | ORAL | 1 refills | Status: DC

## 2021-08-28 NOTE — Progress Notes (Signed)
[facility]  Patient ID: Heather Avery,  MRN: [id number]  PCP: Heather Gravel, MD  Date of Evaluation: 08/28/2021 Time spent:20 minutes  Chief Complaint:  Chief Complaint   Anxiety; Insomnia     HISTORY/CURRENT STATUS: HPI For routine med check. Daughter, Heather Avery with her.   She is doing better about the same as far as her mental health is concerned.  Her memory is a little worse.  She still lives at an assisted living in her own room, does not like to go out of the room.  States she is content there.  Patient denies being depressed.  States she works crossword puzzles to keep her mind sharp.  Energy and motivation are fair depending on the day and how she feels physically.  At 9, she feels she is doing well just to get up and do anything.  Appetite is normal and weight is stable.  Reports no suicidal or homicidal thoughts.  Still has anxiety, more generalized than panic attacks.  She takes the Ativan every morning and the BuSpar daily as directed and says she feels fine like it is.  Sleeps well.  Takes mirtazapine at night.  Feels rested when she gets up.  She does still smoke.  1 pack last a whole week.  She is not planning on quitting.  She saw her pulmonologist last week and her inhalers are at the maximum doses.  No changes there.  Denies dizziness, syncope, seizures, numbness, tingling, tremor, tics, unsteady gait, slurred speech. Denies muscle or joint pain, stiffness, or dystonia.  Individual Medical History/ Review of Systems: Changes? :No    Past medications for mental health diagnoses include: Zoloft, Celexa, Effexor, Paxil, Trintellix, Remeron, Ativan, Viibryd, Cymbalta, Wellbutrin, Ativan, BuSpar  Allergies: Codeine and Hydrocodone  Current Medications:  Current Outpatient Medications:    aspirin 81 MG tablet, Take 160 mg by mouth daily., Disp: , Rfl:    Budeson-Glycopyrrol-Formoterol (BREZTRI AEROSPHERE) 160-9-4.8 MCG/ACT AERO, INHALE 2 PUFFS IN THE MORNING  AND AT BEDTIME, Disp: 11 g, Rfl: 5   busPIRone (BUSPAR) 15 MG tablet, Take 1 tablet (15 mg total) by mouth daily., Disp: 90 tablet, Rfl: 3   Cholecalciferol (VITAMIN D) 50 MCG (2000 UT) CAPS, , Disp: , Rfl:    clopidogrel (PLAVIX) 75 MG tablet, Take 75 mg by mouth daily., Disp: , Rfl:    donepezil (ARICEPT) 10 MG tablet, Take 1 tablet (10 mg total) by mouth at bedtime., Disp: 90 tablet, Rfl: 3   mirtazapine (REMERON) 15 MG tablet, Take 1 tablet (15 mg total) by mouth at bedtime., Disp: 90 tablet, Rfl: 3   PROAIR HFA 108 (90 Base) MCG/ACT inhaler, , Disp: , Rfl:    rosuvastatin (CRESTOR) 10 MG tablet, Take 10 mg by mouth daily., Disp: , Rfl:    SYNTHROID 112 MCG tablet, Take 112 mcg by mouth daily., Disp: , Rfl:    triamterene-hydrochlorothiazide (DYAZIDE) 37.5-25 MG per capsule, Take 1 capsule by mouth every morning., Disp: , Rfl:    LORazepam (ATIVAN) 0.5 MG tablet, Take 1 tablet (0.5 mg total) by mouth every morning., Disp: 90 tablet, Rfl: 1 Medication Side Effects: none  Family Medical/ Social History: Changes?no  MENTAL HEALTH EXAM:  There were no vitals taken for this visit.There is no height or weight on file to calculate BMI.  General Appearance: Casual and Well Groomed  Eye Contact:  Good  Speech:  Clear and Coherent and Normal Rate  Volume:  Normal  Mood:  Euthymic  Affect:  Appropriate  Thought Process:  Goal Directed and Descriptions of Associations: Circumstantial  Orientation:  Full (Time, Place, and Person)  Thought Content: Logical   Suicidal Thoughts:  No  Homicidal Thoughts:  No  Memory:  Immediate;   Poor Recent;   Poor Remote;   Fair  Judgement:  Good  Insight:  Good  Psychomotor Activity:  Normal  Concentration:  Concentration: Good  Recall:  Good  Fund of Knowledge: Good  Language: Good  Assets:  Desire for Improvement  ADL's:  Intact  Cognition: WNL  Prognosis:  Good    DIAGNOSES:    ICD-10-CM   1. Recurrent major depressive disorder, in partial  remission ([place])  F33.41     2. Generalized anxiety disorder  F41.1     3. Insomnia, unspecified type  G47.00     4. Smoker  F17.200        Receiving Psychotherapy: No    RECOMMENDATIONS:  PDMP reviewed. Last Ativan filled 05/29/2021. I provided 20  minutes of face to face time during this encounter, including time spent before and after the visit in records review, medical decision making, counseling pertinent to today's visit, and charting.  Smoking cessation discussed. States she's not going to quit.  She is doing well so no changes need to be made.  Continue Buspar 15 mg, 1 qd. Cont Aricept 10 mg, daily. Continue Ativan 0.5 mg q am.  Continue mirtazapine 15 mg, 1 p.o. nightly. Return in 6 months.  Heather Moat, PA-C

## 2021-09-02 ENCOUNTER — Encounter: Payer: Self-pay | Admitting: Physician Assistant

## 2021-09-11 DIAGNOSIS — L820 Inflamed seborrheic keratosis: Secondary | ICD-10-CM | POA: Diagnosis not present

## 2021-09-11 DIAGNOSIS — X32XXXD Exposure to sunlight, subsequent encounter: Secondary | ICD-10-CM | POA: Diagnosis not present

## 2021-09-11 DIAGNOSIS — L570 Actinic keratosis: Secondary | ICD-10-CM | POA: Diagnosis not present

## 2021-11-13 DIAGNOSIS — L570 Actinic keratosis: Secondary | ICD-10-CM | POA: Diagnosis not present

## 2021-11-13 DIAGNOSIS — D0462 Carcinoma in situ of skin of left upper limb, including shoulder: Secondary | ICD-10-CM | POA: Diagnosis not present

## 2021-11-13 DIAGNOSIS — X32XXXD Exposure to sunlight, subsequent encounter: Secondary | ICD-10-CM | POA: Diagnosis not present

## 2021-12-25 DIAGNOSIS — D0472 Carcinoma in situ of skin of left lower limb, including hip: Secondary | ICD-10-CM | POA: Diagnosis not present

## 2021-12-25 DIAGNOSIS — D0462 Carcinoma in situ of skin of left upper limb, including shoulder: Secondary | ICD-10-CM | POA: Diagnosis not present

## 2022-01-14 ENCOUNTER — Other Ambulatory Visit: Payer: Self-pay | Admitting: Pulmonary Disease

## 2022-01-14 DIAGNOSIS — J449 Chronic obstructive pulmonary disease, unspecified: Secondary | ICD-10-CM

## 2022-02-04 ENCOUNTER — Other Ambulatory Visit: Payer: Self-pay | Admitting: Pulmonary Disease

## 2022-02-04 DIAGNOSIS — J449 Chronic obstructive pulmonary disease, unspecified: Secondary | ICD-10-CM

## 2022-02-12 DIAGNOSIS — C9190 Lymphoid leukemia, unspecified not having achieved remission: Secondary | ICD-10-CM | POA: Diagnosis not present

## 2022-02-12 DIAGNOSIS — E7800 Pure hypercholesterolemia, unspecified: Secondary | ICD-10-CM | POA: Diagnosis not present

## 2022-02-18 ENCOUNTER — Other Ambulatory Visit: Payer: Self-pay | Admitting: Physician Assistant

## 2022-02-19 DIAGNOSIS — E559 Vitamin D deficiency, unspecified: Secondary | ICD-10-CM | POA: Diagnosis not present

## 2022-02-19 DIAGNOSIS — Z23 Encounter for immunization: Secondary | ICD-10-CM | POA: Diagnosis not present

## 2022-02-19 DIAGNOSIS — J449 Chronic obstructive pulmonary disease, unspecified: Secondary | ICD-10-CM | POA: Diagnosis not present

## 2022-02-19 DIAGNOSIS — C9190 Lymphoid leukemia, unspecified not having achieved remission: Secondary | ICD-10-CM | POA: Diagnosis not present

## 2022-02-19 DIAGNOSIS — I10 Essential (primary) hypertension: Secondary | ICD-10-CM | POA: Diagnosis not present

## 2022-02-19 DIAGNOSIS — E7800 Pure hypercholesterolemia, unspecified: Secondary | ICD-10-CM | POA: Diagnosis not present

## 2022-02-19 DIAGNOSIS — H6123 Impacted cerumen, bilateral: Secondary | ICD-10-CM | POA: Diagnosis not present

## 2022-02-19 DIAGNOSIS — E039 Hypothyroidism, unspecified: Secondary | ICD-10-CM | POA: Diagnosis not present

## 2022-03-04 ENCOUNTER — Encounter: Payer: Self-pay | Admitting: Physician Assistant

## 2022-03-04 ENCOUNTER — Ambulatory Visit (INDEPENDENT_AMBULATORY_CARE_PROVIDER_SITE_OTHER): Payer: Medicare Other | Admitting: Physician Assistant

## 2022-03-04 DIAGNOSIS — F3341 Major depressive disorder, recurrent, in partial remission: Secondary | ICD-10-CM | POA: Diagnosis not present

## 2022-03-04 DIAGNOSIS — R413 Other amnesia: Secondary | ICD-10-CM

## 2022-03-04 DIAGNOSIS — G4700 Insomnia, unspecified: Secondary | ICD-10-CM

## 2022-03-04 DIAGNOSIS — F17200 Nicotine dependence, unspecified, uncomplicated: Secondary | ICD-10-CM

## 2022-03-04 DIAGNOSIS — F411 Generalized anxiety disorder: Secondary | ICD-10-CM

## 2022-03-04 MED ORDER — DONEPEZIL HCL 10 MG PO TABS: 10.0000 mg | ORAL_TABLET | Freq: Every day | ORAL | 3 refills | Status: DC

## 2022-03-04 MED ORDER — LORAZEPAM 0.5 MG PO TABS: 0.5000 mg | ORAL_TABLET | Freq: Every morning | ORAL | 1 refills | Status: DC

## 2022-03-04 MED ORDER — BUSPIRONE HCL 15 MG PO TABS: 15.0000 mg | ORAL_TABLET | Freq: Every day | ORAL | 3 refills | Status: DC

## 2022-03-04 MED ORDER — MIRTAZAPINE 15 MG PO TABS: 15.0000 mg | ORAL_TABLET | Freq: Every day | ORAL | 3 refills | Status: DC

## 2022-03-04 NOTE — Progress Notes (Signed)
[facility]  Patient ID: Heather Avery,  MRN: [id number]  PCP: Jani Gravel, MD  Date of Evaluation: 03/04/2022 Time spent:20 minutes  Chief Complaint:  Chief Complaint   Anxiety; Memory Loss    HISTORY/CURRENT STATUS: HPI For routine med check. Daughter, [name] with her.   Patient is able to enjoy things.  Energy and motivation are good.  No extreme sadness, tearfulness, or feelings of hopelessness.  ADLs and personal hygiene are normal.   Appetite has not changed.  Weight is stable.  Anxiety is well controlled.  Takes Ativan only at night.  She is sleeping well with that and the mirtazapine.  Denies suicidal or homicidal thoughts.  Memory is worsening some.  [name] states she has lost sense of time.  She does know her family though.  She is still living independently at an independent living home.  Not cook at all, does not turn on the stove.  She does use the microwave but that is all.  She still smokes a few cigarettes a day and always goes out on the balcony.  Does not smoke inside the apartment.  Works on crossword puzzles a lot.  She does still smoke.  1 pack last a whole week.  She is not planning on quitting.    Denies dizziness, syncope, seizures, numbness, tingling, tremor, tics, unsteady gait, slurred speech. Denies muscle or joint pain, stiffness, or dystonia.  Individual Medical History/ Review of Systems: Changes? :No    Past medications for mental health diagnoses include: Zoloft, Celexa, Effexor, Paxil, Trintellix, Remeron, Ativan, Viibryd, Cymbalta, Wellbutrin, Ativan, BuSpar  Allergies: Codeine and Hydrocodone  Current Medications:  Current Outpatient Medications:    aspirin 81 MG tablet, Take 160 mg by mouth daily., Disp: , Rfl:    BREZTRI AEROSPHERE 160-9-4.8 MCG/ACT AERO, INHALE 2 PUFFS IN THE MORNING AND AT BEDTIME, Disp: 11 g, Rfl: 0   Cholecalciferol (VITAMIN D) 50 MCG (2000 UT) CAPS, , Disp: , Rfl:    clopidogrel (PLAVIX) 75 MG tablet, Take  75 mg by mouth daily., Disp: , Rfl:    PROAIR HFA 108 (90 Base) MCG/ACT inhaler, , Disp: , Rfl:    rosuvastatin (CRESTOR) 10 MG tablet, Take 10 mg by mouth daily., Disp: , Rfl:    SYNTHROID 112 MCG tablet, Take 112 mcg by mouth daily., Disp: , Rfl:    triamterene-hydrochlorothiazide (DYAZIDE) 37.5-25 MG per capsule, Take 1 capsule by mouth every morning., Disp: , Rfl:    busPIRone (BUSPAR) 15 MG tablet, Take 1 tablet (15 mg total) by mouth daily., Disp: 90 tablet, Rfl: 3   donepezil (ARICEPT) 10 MG tablet, Take 1 tablet (10 mg total) by mouth at bedtime., Disp: 90 tablet, Rfl: 3   LORazepam (ATIVAN) 0.5 MG tablet, Take 1 tablet (0.5 mg total) by mouth every morning., Disp: 90 tablet, Rfl: 1   mirtazapine (REMERON) 15 MG tablet, Take 1 tablet (15 mg total) by mouth at bedtime., Disp: 90 tablet, Rfl: 3 Medication Side Effects: none  Family Medical/ Social History: Changes?no  MENTAL HEALTH EXAM:  There were no vitals taken for this visit.There is no height or weight on file to calculate BMI.  General Appearance: Casual and Well Groomed  Eye Contact:  Good  Speech:  Clear and Coherent and Normal Rate  Volume:  Normal  Mood:  Euthymic  Affect:  Appropriate  Thought Process:  Goal Directed and Descriptions of Associations: Circumstantial  Orientation:  Full (Time, Place, and Person)  Thought Content: Logical  Suicidal Thoughts:  No  Homicidal Thoughts:  No  Memory:  Immediate;   Poor Recent;   Poor Remote;   Fair  Judgement:  Good  Insight:  Good  Psychomotor Activity:  Normal  Concentration:  Concentration: Fair and Attention Span: Fair  Recall:  Good  Fund of Knowledge: Good  Language: Good  Assets:  Desire for Improvement  ADL's:  Intact  Cognition: WNL  Prognosis:  Good    DIAGNOSES:    ICD-10-CM   1. Generalized anxiety disorder  F41.1     2. Recurrent major depressive disorder, in partial remission ([place])  F33.41     3. Insomnia, unspecified type  G47.00     4.  Smoker  F17.200     5. Memory loss  R41.3       Receiving Psychotherapy: No   RECOMMENDATIONS:  PDMP reviewed. Last Ativan filled 11/23/2021. I provided 20 minutes of face to face time during this encounter, including time spent before and after the visit in records review, medical decision making, counseling pertinent to today's visit, and charting.   Discussed the memory loss.  Namenda could be added, discussed pros and cons with patient and her daughter.  At this time Kc prefers not to add on another medication and [name] agrees.  They understand it has not meant so much to bring her memory back but to help further memory loss.  If [name] sees her becoming more forgetful than she can call and I will send in a prescription for Namenda.  Continue Buspar 15 mg, 1 qd. Cont Aricept 10 mg, daily. Continue Ativan 0.5 mg q am.  Continue mirtazapine 15 mg, 1 p.o. nightly. Return in 6 months.  Donnal Moat, PA-C

## 2022-03-08 ENCOUNTER — Other Ambulatory Visit: Payer: Self-pay

## 2022-03-08 ENCOUNTER — Other Ambulatory Visit: Payer: Self-pay | Admitting: Pulmonary Disease

## 2022-03-08 ENCOUNTER — Telehealth: Payer: Self-pay | Admitting: Physician Assistant

## 2022-03-08 DIAGNOSIS — J449 Chronic obstructive pulmonary disease, unspecified: Secondary | ICD-10-CM

## 2022-03-08 MED ORDER — LORAZEPAM 0.5 MG PO TABS: 0.5000 mg | ORAL_TABLET | Freq: Every morning | ORAL | 1 refills | Status: DC

## 2022-03-08 NOTE — Telephone Encounter (Signed)
Pended.

## 2022-03-08 NOTE — Telephone Encounter (Signed)
Daughter called at 11:20 to report that she has not been able to fill Heather Avery's rX for Lorazepam.  The [facility] does not have and told her today they don't know when they will get. Please transfer the prescription the [facility] [employer] on [address]

## 2022-03-12 DIAGNOSIS — S90819A Abrasion, unspecified foot, initial encounter: Secondary | ICD-10-CM | POA: Diagnosis not present

## 2022-03-12 DIAGNOSIS — Z08 Encounter for follow-up examination after completed treatment for malignant neoplasm: Secondary | ICD-10-CM | POA: Diagnosis not present

## 2022-03-12 DIAGNOSIS — Z85828 Personal history of other malignant neoplasm of skin: Secondary | ICD-10-CM | POA: Diagnosis not present

## 2022-05-06 DIAGNOSIS — L308 Other specified dermatitis: Secondary | ICD-10-CM | POA: Diagnosis not present

## 2022-05-06 DIAGNOSIS — L280 Lichen simplex chronicus: Secondary | ICD-10-CM | POA: Diagnosis not present

## 2022-06-25 DIAGNOSIS — L928 Other granulomatous disorders of the skin and subcutaneous tissue: Secondary | ICD-10-CM | POA: Diagnosis not present

## 2022-06-25 DIAGNOSIS — B078 Other viral warts: Secondary | ICD-10-CM | POA: Diagnosis not present

## 2022-08-05 DIAGNOSIS — L0100 Impetigo, unspecified: Secondary | ICD-10-CM | POA: Diagnosis not present

## 2022-08-14 DIAGNOSIS — E559 Vitamin D deficiency, unspecified: Secondary | ICD-10-CM | POA: Diagnosis not present

## 2022-08-14 DIAGNOSIS — E789 Disorder of lipoprotein metabolism, unspecified: Secondary | ICD-10-CM | POA: Diagnosis not present

## 2022-08-14 DIAGNOSIS — R739 Hyperglycemia, unspecified: Secondary | ICD-10-CM | POA: Diagnosis not present

## 2022-08-14 DIAGNOSIS — I10 Essential (primary) hypertension: Secondary | ICD-10-CM | POA: Diagnosis not present

## 2022-08-14 DIAGNOSIS — E039 Hypothyroidism, unspecified: Secondary | ICD-10-CM | POA: Diagnosis not present

## 2022-08-28 DIAGNOSIS — E7800 Pure hypercholesterolemia, unspecified: Secondary | ICD-10-CM | POA: Diagnosis not present

## 2022-08-28 DIAGNOSIS — E039 Hypothyroidism, unspecified: Secondary | ICD-10-CM | POA: Diagnosis not present

## 2022-08-28 DIAGNOSIS — I10 Essential (primary) hypertension: Secondary | ICD-10-CM | POA: Diagnosis not present

## 2022-08-28 DIAGNOSIS — I639 Cerebral infarction, unspecified: Secondary | ICD-10-CM | POA: Diagnosis not present

## 2022-08-28 DIAGNOSIS — Z23 Encounter for immunization: Secondary | ICD-10-CM | POA: Diagnosis not present

## 2022-08-28 DIAGNOSIS — E559 Vitamin D deficiency, unspecified: Secondary | ICD-10-CM | POA: Diagnosis not present

## 2022-08-28 DIAGNOSIS — Z0000 Encounter for general adult medical examination without abnormal findings: Secondary | ICD-10-CM | POA: Diagnosis not present

## 2022-08-28 DIAGNOSIS — J449 Chronic obstructive pulmonary disease, unspecified: Secondary | ICD-10-CM | POA: Diagnosis not present

## 2022-08-28 DIAGNOSIS — C9190 Lymphoid leukemia, unspecified not having achieved remission: Secondary | ICD-10-CM | POA: Diagnosis not present

## 2022-09-03 ENCOUNTER — Encounter: Payer: Self-pay | Admitting: Physician Assistant

## 2022-09-03 ENCOUNTER — Ambulatory Visit: Payer: Medicare Other | Admitting: Physician Assistant

## 2022-09-03 DIAGNOSIS — R413 Other amnesia: Secondary | ICD-10-CM

## 2022-09-03 DIAGNOSIS — F411 Generalized anxiety disorder: Secondary | ICD-10-CM

## 2022-09-03 DIAGNOSIS — F3341 Major depressive disorder, recurrent, in partial remission: Secondary | ICD-10-CM | POA: Diagnosis not present

## 2022-09-03 DIAGNOSIS — F17200 Nicotine dependence, unspecified, uncomplicated: Secondary | ICD-10-CM | POA: Diagnosis not present

## 2022-09-03 DIAGNOSIS — C9110 Chronic lymphocytic leukemia of B-cell type not having achieved remission: Secondary | ICD-10-CM

## 2022-09-03 MED ORDER — LORAZEPAM 0.5 MG PO TABS: 0.5000 mg | ORAL_TABLET | Freq: Every morning | ORAL | 1 refills | Status: DC

## 2022-09-03 NOTE — Progress Notes (Signed)
[facility]  Patient ID: Heather Avery,  MRN: [id number]  PCP: Pearson Grippe, MD  Date of Evaluation: 09/03/2022 Time spent:30 minutes  Chief Complaint:  Chief Complaint   Memory Loss    HISTORY/CURRENT STATUS: HPI For routine med check. Daughter, Heather Avery with her.   Heather Avery is here for her 49-month medication check.  Heather Avery states her memory is much worse.  Patient states that she is 89 years old and that is to be expected.  Patient works crossword puzzles and watches game shows which she states keeps her mind working well.  She forgets where she puts things, the date, day of week, cannot remember conversations.  She lives in her own apartment at [place].  She has been on Aricept for quite a while.  Her appetite is normal, she is eating and drinking just fine.  She does not brush her teeth or bathe regularly though.  Heather Avery states that she could do it if she would but when they were at her PCP's office last week for routine physical Heather. Avery told a provider there she did not do it because she did not want to do it, it was Heather Avery job.  That is not like her.  Heather Avery has told her if she does not start bathing then the people at [facility] will think she cannot do it and will move her to assisted living.  Patient does not want that.  She continues to smoke, but only a few cigarettes a day.  1 pack will last a week most of the time.  She does not want to quit.  She takes the Ativan every morning.  It keeps her from being too anxious throughout the day.  No extreme sadness, tearfulness, or feelings of hopelessness.  Sleeps well most of the time.  Appetite has not changed.  Weight is stable.  Denies suicidal or homicidal thoughts.  Patient denies increased energy with decreased need for sleep, increased talkativeness, racing thoughts, impulsivity or risky behaviors, increased spending, increased libido, grandiosity, increased irritability or anger, paranoia, or  hallucinations.  Denies dizziness, syncope, seizures, numbness, tingling, tremor, tics, unsteady gait, slurred speech. Denies muscle or joint pain, stiffness, or dystonia.  Individual Medical History/ Review of Systems: Changes? :No    Past medications for mental health diagnoses include: Zoloft, Celexa, Effexor, Paxil, Trintellix, Remeron, Ativan, Viibryd, Cymbalta, Wellbutrin, Ativan, BuSpar  Allergies: Codeine and Hydrocodone  Current Medications:  Current Outpatient Medications:    aspirin 81 MG tablet, Take 160 mg by mouth daily., Disp: , Rfl:    BREZTRI AEROSPHERE 160-9-4.8 MCG/ACT AERO, INHALE 2 PUFFS IN THE MORNING AND AT BEDTIME, Disp: 11 g, Rfl: 0   busPIRone (BUSPAR) 15 MG tablet, Take 1 tablet (15 mg total) by mouth daily., Disp: 90 tablet, Rfl: 3   Cholecalciferol (VITAMIN D) 50 MCG (2000 UT) CAPS, , Disp: , Rfl:    clopidogrel (PLAVIX) 75 MG tablet, Take 75 mg by mouth daily., Disp: , Rfl:    donepezil (ARICEPT) 10 MG tablet, Take 1 tablet (10 mg total) by mouth at bedtime., Disp: 90 tablet, Rfl: 3   mirtazapine (REMERON) 15 MG tablet, Take 1 tablet (15 mg total) by mouth at bedtime., Disp: 90 tablet, Rfl: 3   PROAIR HFA 108 (90 Base) MCG/ACT inhaler, , Disp: , Rfl:    rosuvastatin (CRESTOR) 10 MG tablet, Take 10 mg by mouth daily., Disp: , Rfl:    SYNTHROID 112 MCG tablet, Take 112 mcg by mouth daily., Disp: , Rfl:  triamterene-hydrochlorothiazide (DYAZIDE) 37.5-25 MG per capsule, Take 1 capsule by mouth every morning., Disp: , Rfl:    LORazepam (ATIVAN) 0.5 MG tablet, Take 1 tablet (0.5 mg total) by mouth every morning., Disp: 90 tablet, Rfl: 1 Medication Side Effects: none  Family Medical/ Social History: Changes?no  MENTAL HEALTH EXAM:  There were no vitals taken for this visit.There is no height or weight on file to calculate BMI.  General Appearance: Casual and Well Groomed  Eye Contact:  Good  Speech:  Clear and Coherent and Normal Rate  Volume:  Normal   Mood:  Euthymic  Affect:  Appropriate  Thought Process:  Goal Directed and Descriptions of Associations: Circumstantial  Orientation:  Full (Time, Place, and Person)  Thought Content: Logical   Suicidal Thoughts:  No  Homicidal Thoughts:  No  Memory:  Immediate;   Poor Recent;   Poor Remote;   Fair  Judgement:  Good  Insight:  Good  Psychomotor Activity:  Normal  Concentration:  Concentration: Fair and Attention Span: Fair  Recall:  Good  Fund of Knowledge: Good  Language: Good  Assets:  Desire for Improvement Financial Resources/Insurance Housing  ADL's:  Intact  Cognition: WNL  Prognosis:  Good      09/03/2022    3:28 PM 09/03/2022    2:18 PM  MMSE - Mini Mental State Exam  Orientation to time 3 3  Orientation to Place 3 3  Registration 3 3  Attention/ Calculation 3 3  Recall 2 2  Language- name 2 objects 2   Language- repeat 1   Language- follow 3 step command 3   Language- read & follow direction 1   Write a sentence 1   Copy design 1   Total score 23    DIAGNOSES:    ICD-10-CM   1. Recurrent major depressive disorder, in partial remission (HCC)  F33.41     2. Memory loss  R41.3     3. Generalized anxiety disorder  F41.1     4. Smoker  F17.200     5. CLL (chronic lymphocytic leukemia) (HCC)  C91.10      Receiving Psychotherapy: No   RECOMMENDATIONS:  PDMP reviewed. Last Ativan filled 06/03/2022. I provided 30 minutes of face to face time during this encounter, including time spent before and after the visit in records review, medical decision making, counseling pertinent to today's visit, and charting.   We had a long discussion about her memory issues.  She did remarkably well on the MMSE considering her memory loss.  It is definitely not ideal however.  We have discussed Namenda in the past but agreed not to start that.  The pros and cons were given to Heather Avery and Heather Avery, including the fact that Namenda and Aricept may not improve her memory but only  halt the progression of memory loss.  Given the fact that she has COPD, CLL, and has a history of strokes, at her age it is unlikely that adding Namenda would increase her quality of life.  They understand and agree not to pursue further treatment, however I will prescribe Namenda at any point if they change their mind.  I also offered to send her to a neurologist for evaluation, they would most likely order a CT or MRI of the brain, but if further treatment is not planned then in my opinion it is not wise to put her through those tests.  Patient and her daughter agree.  We discussed the fact that she  needs to brush her teeth at least once a day.  She also needs to bathe regularly.  She states she does not like to get in the shower but she had been bathing off with the baby wipes before the past 6 months or more.  She repeated back to me these instructions and verbalized understanding that she may be required to go to an assisted living home if she is unable or unwilling to do for herself.  Her mood is stable on the current treatment so no changes will be made.  Smoking cessation briefly discussed.  States she will not quit.  Continue Buspar 15 mg, 1 qd. Cont Aricept 10 mg, daily. Continue Ativan 0.5 mg q am.  Continue mirtazapine 15 mg, 1 p.o. nightly. Return in 6 months.  Melony Overly, PA-C

## 2022-09-05 ENCOUNTER — Ambulatory Visit: Payer: Medicare Other | Admitting: Pulmonary Disease

## 2022-09-05 ENCOUNTER — Encounter: Payer: Self-pay | Admitting: Pulmonary Disease

## 2022-09-05 VITALS — BP 118/72 | HR 71 | Ht 66.0 in | Wt 154.0 lb

## 2022-09-05 DIAGNOSIS — F17210 Nicotine dependence, cigarettes, uncomplicated: Secondary | ICD-10-CM

## 2022-09-05 DIAGNOSIS — J449 Chronic obstructive pulmonary disease, unspecified: Secondary | ICD-10-CM

## 2022-09-05 NOTE — Patient Instructions (Signed)
Continue breztri inhaler 2 puffs twice daily - rinse mouth out after each use  Continue to use albuterol inhaler 1-2 puffs every 4-6 hours as needed  Follow up in 1 year

## 2022-09-05 NOTE — Progress Notes (Addendum)
Synopsis: Referred by Pearson Grippe, MD for COPD  Subjective:   PATIENT ID: Heather Avery GENDER: female DOB: [birth year], MRN: [id number]  HPI  Chief Complaint  Patient presents with   Follow-up    29yr f/u for COPD. Still using Breztri and albuterol. Per daughter, she is using the albuterol multiple times a day.    Heather Avery is an 89 year old woman, daily smoker with history of dementia, CVA, chronic lymphocytic leukemia, hypertension and COPD who returns to pulmonary clinic for COPD follow up.   She is unable to walk 105ft due to dyspnea. She is using breztri 2 puffs twice daily and as needed albuterol. No other complaints at this time.  OV 08/2021 She has been doing well since last visit. She has progressive dyspnea per the daughter. She continues to smoke 4-5 cigs per day. She is using breztri 2 puffs twice daily and albuterol inhaler as needed. No exacerbations of her COPD since last visit.  OV 08/29/20 She is accompanied by her daughter, Leanord Asal for today's visit. The patient lives alone in a retirement community and is independent in her daily activities. Her daughter checks in on her frequently and helps organize her medications for the week.   The patient is currently using bevespi twice daily and albuterol as needed. There is reported confusion between the inhalers as she can use the bevespi as needed throughout the day instead of albuterol.   She continues to smoke cigarettes daily. She denies issues with her breathing at this time, only dyspnea with exertion.  Past Medical History:  Diagnosis Date   Blind right eye    Carotid artery occlusion    COPD (chronic obstructive pulmonary disease) (HCC)    Degeneration of intervertebral disc    Hearing loss in left ear    Hypertension    Leukemia (HCC)    Skin cancer    Stroke [facility])      History reviewed. No pertinent family history.   Social History   Socioeconomic History   Marital status: Married    Spouse name:  Not on file   Number of children: Not on file   Years of education: Not on file   Highest education level: Not on file  Occupational History   Not on file  Tobacco Use   Smoking status: Every Day    Packs/day: .2    Types: Cigarettes   Smokeless tobacco: Never  Substance and Sexual Activity   Alcohol use: Not Currently   Drug use: No   Sexual activity: Not Currently  Other Topics Concern   Not on file  Social History Narrative   Not on file   Social Determinants of Health   Financial Resource Strain: Not on file  Food Insecurity: Not on file  Transportation Needs: Not on file  Physical Activity: Not on file  Stress: Not on file  Social Connections: Not on file  Intimate Partner Violence: Not on file     Allergies  Allergen Reactions   Codeine    Hydrocodone Other (See Comments)    "Feels nervous and anxious".   Patient takes as needed     Outpatient Medications Prior to Visit  Medication Sig Dispense Refill   aspirin 81 MG tablet Take 160 mg by mouth daily.     BREZTRI AEROSPHERE 160-9-4.8 MCG/ACT AERO INHALE 2 PUFFS IN THE MORNING AND AT BEDTIME 11 g 0   busPIRone (BUSPAR) 15 MG tablet Take 1 tablet (15 mg total) by  mouth daily. 90 tablet 3   Cholecalciferol (VITAMIN D) 50 MCG (2000 UT) CAPS      clopidogrel (PLAVIX) 75 MG tablet Take 75 mg by mouth daily.     donepezil (ARICEPT) 10 MG tablet Take 1 tablet (10 mg total) by mouth at bedtime. 90 tablet 3   LORazepam (ATIVAN) 0.5 MG tablet Take 1 tablet (0.5 mg total) by mouth every morning. 90 tablet 1   mirtazapine (REMERON) 15 MG tablet Take 1 tablet (15 mg total) by mouth at bedtime. 90 tablet 3   PROAIR HFA 108 (90 Base) MCG/ACT inhaler      rosuvastatin (CRESTOR) 10 MG tablet Take 10 mg by mouth daily.     SYNTHROID 112 MCG tablet Take 112 mcg by mouth daily.     triamterene-hydrochlorothiazide (DYAZIDE) 37.5-25 MG per capsule Take 1 capsule by mouth every morning.     No facility-administered medications  prior to visit.    Review of Systems  Constitutional:  Negative for chills, diaphoresis, fever, malaise/fatigue and weight loss.  HENT:  Negative for congestion, sinus pain and sore throat.   Eyes: Negative.   Respiratory:  Positive for shortness of breath. Negative for cough, hemoptysis, sputum production and wheezing.   Cardiovascular:  Negative for chest pain, orthopnea, leg swelling and PND.  Gastrointestinal:  Negative for heartburn.  Genitourinary: Negative.   Musculoskeletal: Negative.   Neurological:  Negative for dizziness, weakness and headaches.  Endo/Heme/Allergies:  Does not bruise/bleed easily.  Psychiatric/Behavioral:  Positive for memory loss.     Objective:   Vitals:   09/05/22 1619  BP: 118/72  Pulse: 71  SpO2: 98%  Weight: 154 lb (69.9 kg)  Height: 5\' 6"  (1.676 m)   Physical Exam Constitutional:      Appearance: Normal appearance.  HENT:     Head: Normocephalic and atraumatic.  Eyes:     General: No scleral icterus.    Conjunctiva/sclera: Conjunctivae normal.  Cardiovascular:     Rate and Rhythm: Normal rate and regular rhythm.     Pulses: Normal pulses.     Heart sounds: Normal heart sounds.  Pulmonary:     Effort: Pulmonary effort is normal.     Breath sounds: Normal breath sounds. No wheezing, rhonchi or rales.  Musculoskeletal:     Cervical back: Neck supple.     Right lower leg: No edema.     Left lower leg: No edema.  Lymphadenopathy:     Cervical: No cervical adenopathy.  Skin:    General: Skin is warm.  Neurological:     General: No focal deficit present.     Mental Status: She is alert.     CBC    Component Value Date/Time   WBC 10.3 08/07/2021 1501   WBC 14.3 (H) 08/05/2017 1455   RBC 4.10 08/07/2021 1501   HGB 12.8 08/07/2021 1501   HGB 13.8 08/06/2016 1447   HCT 38.4 08/07/2021 1501   HCT 41.5 08/06/2016 1447   PLT 179 08/07/2021 1501   PLT 189 08/06/2016 1447   MCV 93.7 08/07/2021 1501   MCV 90.2 08/06/2016 1447    MCH 31.2 08/07/2021 1501   MCHC 33.3 08/07/2021 1501   RDW 14.4 08/07/2021 1501   RDW 14.2 08/06/2016 1447   LYMPHSABS 6.1 (H) 08/07/2021 1501   LYMPHSABS 12.3 (H) 08/06/2016 1447   MONOABS 0.7 08/07/2021 1501   MONOABS 0.7 08/06/2016 1447   EOSABS 0.1 08/07/2021 1501   EOSABS 0.0 08/06/2016 1447   BASOSABS 0.0 08/07/2021  1501   BASOSABS 0.0 08/06/2016 1447      Latest Ref Rng & Units 08/08/2020    3:07 PM 08/09/2019    3:03 PM 08/05/2017    2:55 PM  BMP  Glucose 70 - 99 mg/dL 92  95  98   BUN 8 - 23 mg/dL 20  14  11    Creatinine 0.44 - 1.00 mg/dL 1.19  1.47  8.29   Sodium 135 - 145 mmol/L 142  144  138   Potassium 3.5 - 5.1 mmol/L 3.7  3.3  3.1   Chloride 98 - 111 mmol/L 102  100  100   CO2 22 - 32 mmol/L 27  31  29    Calcium 8.9 - 10.3 mg/dL 8.9  9.0  9.5     Chest imaging: CXR 08/29/20 Hyperinflated lungs, no nodules or masses noted. No pleural effusions  CXR 2012 Findings: The lungs are clear but hyperaerated consistent with  COPD.  No active infiltrate or effusion is seen.  Mediastinal  contours are stable.  The heart is mildly enlarged.  There is mild  thoracolumbar scoliosis present.  PFT:     No data to display          Labs: Reviewed as above     Assessment & Plan:   Chronic obstructive pulmonary disease, unspecified COPD type (HCC)  Cigarette smoker  Discussion: Heather Avery is an 89 year old woman, daily smoker with history of dementia, CVA, chronic lymphocytic leukemia, hypertension and COPD who returns to pulmonary clinic for COPD follow up.   She is to continue breztri inhaler 2 puffs twice daily. She can continue as needed albuterol inhaler.   Follow up in 1 year.  Melody Comas, MD [facility] Office: [phone]   Current Outpatient Medications:    aspirin 81 MG tablet, Take 160 mg by mouth daily., Disp: , Rfl:    BREZTRI AEROSPHERE 160-9-4.8 MCG/ACT AERO, INHALE 2 PUFFS IN THE MORNING AND AT BEDTIME, Disp: 11  g, Rfl: 0   busPIRone (BUSPAR) 15 MG tablet, Take 1 tablet (15 mg total) by mouth daily., Disp: 90 tablet, Rfl: 3   Cholecalciferol (VITAMIN D) 50 MCG (2000 UT) CAPS, , Disp: , Rfl:    clopidogrel (PLAVIX) 75 MG tablet, Take 75 mg by mouth daily., Disp: , Rfl:    donepezil (ARICEPT) 10 MG tablet, Take 1 tablet (10 mg total) by mouth at bedtime., Disp: 90 tablet, Rfl: 3   LORazepam (ATIVAN) 0.5 MG tablet, Take 1 tablet (0.5 mg total) by mouth every morning., Disp: 90 tablet, Rfl: 1   mirtazapine (REMERON) 15 MG tablet, Take 1 tablet (15 mg total) by mouth at bedtime., Disp: 90 tablet, Rfl: 3   PROAIR HFA 108 (90 Base) MCG/ACT inhaler, , Disp: , Rfl:    rosuvastatin (CRESTOR) 10 MG tablet, Take 10 mg by mouth daily., Disp: , Rfl:    SYNTHROID 112 MCG tablet, Take 112 mcg by mouth daily., Disp: , Rfl:    triamterene-hydrochlorothiazide (DYAZIDE) 37.5-25 MG per capsule, Take 1 capsule by mouth every morning., Disp: , Rfl:

## 2022-11-27 ENCOUNTER — Other Ambulatory Visit: Payer: Self-pay | Admitting: Physician Assistant

## 2023-01-01 ENCOUNTER — Other Ambulatory Visit: Payer: Self-pay | Admitting: Pulmonary Disease

## 2023-01-01 DIAGNOSIS — J449 Chronic obstructive pulmonary disease, unspecified: Secondary | ICD-10-CM

## 2023-01-02 ENCOUNTER — Telehealth: Payer: Self-pay | Admitting: Pulmonary Disease

## 2023-01-02 DIAGNOSIS — J449 Chronic obstructive pulmonary disease, unspecified: Secondary | ICD-10-CM

## 2023-01-02 NOTE — Telephone Encounter (Signed)
Bjorn Loser daughter states patient needs refill for [employer] inhaler. Pharmacy is [facility]. [place] . Rhona phone number is [phone].

## 2023-01-06 NOTE — Telephone Encounter (Signed)
Refill sent 01/02/23

## 2023-02-25 DIAGNOSIS — E559 Vitamin D deficiency, unspecified: Secondary | ICD-10-CM | POA: Diagnosis not present

## 2023-02-25 DIAGNOSIS — I10 Essential (primary) hypertension: Secondary | ICD-10-CM | POA: Diagnosis not present

## 2023-02-25 DIAGNOSIS — E7800 Pure hypercholesterolemia, unspecified: Secondary | ICD-10-CM | POA: Diagnosis not present

## 2023-02-25 DIAGNOSIS — Z0000 Encounter for general adult medical examination without abnormal findings: Secondary | ICD-10-CM | POA: Diagnosis not present

## 2023-02-26 ENCOUNTER — Encounter: Payer: Self-pay | Admitting: Physician Assistant

## 2023-02-26 ENCOUNTER — Ambulatory Visit: Payer: Medicare Other | Admitting: Physician Assistant

## 2023-02-26 DIAGNOSIS — G4700 Insomnia, unspecified: Secondary | ICD-10-CM

## 2023-02-26 DIAGNOSIS — F411 Generalized anxiety disorder: Secondary | ICD-10-CM

## 2023-02-26 DIAGNOSIS — F3341 Major depressive disorder, recurrent, in partial remission: Secondary | ICD-10-CM

## 2023-02-26 DIAGNOSIS — F17200 Nicotine dependence, unspecified, uncomplicated: Secondary | ICD-10-CM

## 2023-02-26 DIAGNOSIS — C9110 Chronic lymphocytic leukemia of B-cell type not having achieved remission: Secondary | ICD-10-CM

## 2023-02-26 MED ORDER — MIRTAZAPINE 15 MG PO TABS: 15.0000 mg | ORAL_TABLET | Freq: Every day | ORAL | 3 refills | Status: DC

## 2023-02-26 MED ORDER — LORAZEPAM 0.5 MG PO TABS: 0.5000 mg | ORAL_TABLET | Freq: Every morning | ORAL | 1 refills | Status: DC

## 2023-02-26 MED ORDER — DONEPEZIL HCL 10 MG PO TABS: 10.0000 mg | ORAL_TABLET | Freq: Every day | ORAL | 3 refills | Status: DC

## 2023-02-26 MED ORDER — BUSPIRONE HCL 15 MG PO TABS: 15.0000 mg | ORAL_TABLET | Freq: Every day | ORAL | 3 refills | Status: DC

## 2023-02-26 NOTE — Progress Notes (Signed)
[facility]  Patient ID: Heather Avery,  MRN: [id number]  PCP: Pearson Grippe, MD  Date of Evaluation: 02/26/2023 Time spent:20 minutes  Chief Complaint:  Chief Complaint   Anxiety    HISTORY/CURRENT STATUS: HPI For routine med check. Daughter, Bjorn Loser with her.   Memory is getting worse.  She lives in an assisted living facility so she does not cook.  She does remember her kids but not all of her grandkids. Is 89 yo, pt says 'it's what happens when you get my age.' She nor dtr want to have her on another medication unless it will help her memory, not just to prevent it from getting worse.  At this time she is not at risk for self-harm except with smoking a few cigarettes per day.  She has not left 1 lit and forgotten about it.  Her pulmonologist is not giving her oxygen even though she needs it because of the risk with smoking.   Hellon and Rhonda feel like her medication is working as needed.  She is not having a lot of anxiety.  She takes the Ativan every day.  If she does not take it she gets anxious.  She works crossword puzzles but that is about it as far as enjoyment goes.  She watches some TV.  Appetite is normal and weight is stable.  She sleeps well.  She does not want to shower and does not for days.  Bjorn Loser has got to where she does not bring it up anymore because her mom does what she wants to do anyway. No SI/HI.   Denies dizziness, syncope, seizures, numbness, tingling, tremor, tics, unsteady gait, slurred speech. Denies muscle or joint pain, stiffness, or dystonia.  Individual Medical History/ Review of Systems: Changes? :No    Past medications for mental health diagnoses include: Zoloft, Celexa, Effexor, Paxil, Trintellix, Remeron, Ativan, Viibryd, Cymbalta, Wellbutrin, Ativan, BuSpar  Allergies: Codeine and Hydrocodone  Current Medications:  Current Outpatient Medications:    aspirin 81 MG tablet, Take 160 mg by mouth daily., Disp: , Rfl:    BREZTRI  AEROSPHERE 160-9-4.8 MCG/ACT AERO, INHALE 2 PUFFS EACH MORNING AND AT BEDTIME., Disp: 10.7 each, Rfl: 8   Cholecalciferol (VITAMIN D) 50 MCG (2000 UT) CAPS, , Disp: , Rfl:    clopidogrel (PLAVIX) 75 MG tablet, Take 75 mg by mouth daily., Disp: , Rfl:    PROAIR HFA 108 (90 Base) MCG/ACT inhaler, , Disp: , Rfl:    rosuvastatin (CRESTOR) 10 MG tablet, Take 10 mg by mouth daily., Disp: , Rfl:    SYNTHROID 112 MCG tablet, Take 112 mcg by mouth daily., Disp: , Rfl:    triamterene-hydrochlorothiazide (DYAZIDE) 37.5-25 MG per capsule, Take 1 capsule by mouth every morning., Disp: , Rfl:    busPIRone (BUSPAR) 15 MG tablet, Take 1 tablet (15 mg total) by mouth daily., Disp: 90 tablet, Rfl: 3   donepezil (ARICEPT) 10 MG tablet, Take 1 tablet (10 mg total) by mouth at bedtime., Disp: 90 tablet, Rfl: 3   LORazepam (ATIVAN) 0.5 MG tablet, Take 1 tablet (0.5 mg total) by mouth every morning., Disp: 90 tablet, Rfl: 1   mirtazapine (REMERON) 15 MG tablet, Take 1 tablet (15 mg total) by mouth at bedtime., Disp: 90 tablet, Rfl: 3 Medication Side Effects: none  Family Medical/ Social History: Changes?no  MENTAL HEALTH EXAM:  There were no vitals taken for this visit.There is no height or weight on file to calculate BMI.  General Appearance: Casual and Well  Groomed  Eye Contact:  Good  Speech:  Clear and Coherent and Normal Rate  Volume:  Normal  Mood:  Euthymic  Affect:  Appropriate  Thought Process:  Goal Directed and Descriptions of Associations: Circumstantial  Orientation:  Full (Time, Place, and Person)  Thought Content: Logical   Suicidal Thoughts:  No  Homicidal Thoughts:  No  Memory:  Immediate;   Poor Recent;   Poor Remote;   Poor  Judgement:  Good  Insight:  Good  Psychomotor Activity:  Normal  Concentration:  Concentration: Fair and Attention Span: Fair  Recall:  Good  Fund of Knowledge: Good  Language: Good  Assets:  Desire for Improvement Financial Resources/Insurance Housing   ADL's:  Intact  Cognition: WNL  Prognosis:  Good      09/03/2022    3:28 PM 09/03/2022    2:18 PM  MMSE - Mini Mental State Exam  Orientation to time 3 3  Orientation to Place 3 3  Registration 3 3  Attention/ Calculation 3 3  Recall 2 2  Language- name 2 objects 2   Language- repeat 1   Language- follow 3 step command 3   Language- read & follow direction 1   Write a sentence 1   Copy design 1   Total score 23    DIAGNOSES:    ICD-10-CM   1. Recurrent major depressive disorder, in partial remission (HCC)  F33.41     2. Generalized anxiety disorder  F41.1     3. Smoker  F17.200     4. Insomnia, unspecified type  G47.00     5. CLL (chronic lymphocytic leukemia) (HCC)  C91.10       Receiving Psychotherapy: No   RECOMMENDATIONS:  PDMP reviewed. Last Ativan filled 11/30/2022. I provided 20 minutes of face to face time during this encounter, including time spent before and after the visit in records review, medical decision making, counseling pertinent to today's visit, and charting.   Smoking cessation briefly discussed.  States she will not quit.  We briefly discussed adding Namenda.  The 3 of [place] agree that at her age it is doubtful to make a difference so we agreed to leave everything the same.  Continue Buspar 15 mg, 1 qd. Cont Aricept 10 mg, daily. Continue Ativan 0.5 mg q am.  Continue mirtazapine 15 mg, 1 p.o. nightly. Return in 6 months.  Melony Overly, PA-C

## 2023-03-04 DIAGNOSIS — Z23 Encounter for immunization: Secondary | ICD-10-CM | POA: Diagnosis not present

## 2023-03-04 DIAGNOSIS — E7800 Pure hypercholesterolemia, unspecified: Secondary | ICD-10-CM | POA: Diagnosis not present

## 2023-03-04 DIAGNOSIS — E039 Hypothyroidism, unspecified: Secondary | ICD-10-CM | POA: Diagnosis not present

## 2023-03-04 DIAGNOSIS — Z8673 Personal history of transient ischemic attack (TIA), and cerebral infarction without residual deficits: Secondary | ICD-10-CM | POA: Diagnosis not present

## 2023-03-04 DIAGNOSIS — E559 Vitamin D deficiency, unspecified: Secondary | ICD-10-CM | POA: Diagnosis not present

## 2023-03-04 DIAGNOSIS — C9190 Lymphoid leukemia, unspecified not having achieved remission: Secondary | ICD-10-CM | POA: Diagnosis not present

## 2023-06-22 DIAGNOSIS — I959 Hypotension, unspecified: Secondary | ICD-10-CM | POA: Diagnosis not present

## 2023-06-22 DIAGNOSIS — R404 Transient alteration of awareness: Secondary | ICD-10-CM | POA: Diagnosis not present

## 2023-06-22 DIAGNOSIS — R55 Syncope and collapse: Secondary | ICD-10-CM | POA: Diagnosis not present

## 2023-06-22 DIAGNOSIS — R29810 Facial weakness: Secondary | ICD-10-CM | POA: Diagnosis not present

## 2023-07-08 DIAGNOSIS — 419620001 Death: Secondary | SNOMED CT | POA: Diagnosis not present

## 2023-07-08 DEATH — deceased

## 2023-08-27 ENCOUNTER — Ambulatory Visit: Payer: Medicare Other | Admitting: Physician Assistant
